# Patient Record
Sex: Female | Born: 1995 | Race: Black or African American | Hispanic: No | Marital: Single | Smoking: Current every day smoker
Health system: Southern US, Community
[De-identification: ages and names within clinical notes are randomized; demographics above are authoritative.]

## PROBLEM LIST (undated history)

## (undated) DIAGNOSIS — D573 Sickle-cell trait: Secondary | ICD-10-CM

## (undated) HISTORY — DX: Sickle-cell trait: D57.3

## (undated) HISTORY — PX: OTHER SURGICAL HISTORY: SHX169

---
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
## 2016-12-15 ENCOUNTER — Encounter (HOSPITAL_COMMUNITY): Payer: Self-pay | Admitting: Nurse Practitioner

## 2016-12-15 ENCOUNTER — Emergency Department (HOSPITAL_COMMUNITY): Payer: Medicaid Other

## 2016-12-15 DIAGNOSIS — R0789 Other chest pain: Secondary | ICD-10-CM | POA: Insufficient documentation

## 2016-12-15 DIAGNOSIS — Z5321 Procedure and treatment not carried out due to patient leaving prior to being seen by health care provider: Secondary | ICD-10-CM | POA: Diagnosis not present

## 2016-12-15 LAB — LIPASE, BLOOD: Lipase: 36 U/L (ref 11–51)

## 2016-12-15 LAB — CBC
HCT: 35.6 % — ABNORMAL LOW (ref 36.0–46.0)
Hemoglobin: 12.1 g/dL (ref 12.0–15.0)
MCH: 28.9 pg (ref 26.0–34.0)
MCHC: 34 g/dL (ref 30.0–36.0)
MCV: 85 fL (ref 78.0–100.0)
Platelets: 237 10*3/uL (ref 150–400)
RBC: 4.19 MIL/uL (ref 3.87–5.11)
RDW: 12.3 % (ref 11.5–15.5)
WBC: 5.4 10*3/uL (ref 4.0–10.5)

## 2016-12-15 LAB — COMPREHENSIVE METABOLIC PANEL
ALT: 13 U/L — ABNORMAL LOW (ref 14–54)
AST: 22 U/L (ref 15–41)
Albumin: 3.9 g/dL (ref 3.5–5.0)
Alkaline Phosphatase: 52 U/L (ref 38–126)
Anion gap: 8 (ref 5–15)
BUN: 10 mg/dL (ref 6–20)
CO2: 23 mmol/L (ref 22–32)
Calcium: 9.2 mg/dL (ref 8.9–10.3)
Chloride: 106 mmol/L (ref 101–111)
Creatinine, Ser: 0.77 mg/dL (ref 0.44–1.00)
GFR calc Af Amer: >60 mL/min (ref >60)
GFR calc non Af Amer: >60 mL/min (ref >60)
Glucose, Bld: 113 mg/dL — ABNORMAL HIGH (ref 65–99)
Potassium: 3.6 mmol/L (ref 3.5–5.1)
Sodium: 137 mmol/L (ref 135–145)
Total Bilirubin: 0.4 mg/dL (ref 0.3–1.2)
Total Protein: 7.3 g/dL (ref 6.5–8.1)

## 2016-12-15 LAB — URINALYSIS, ROUTINE W REFLEX MICROSCOPIC
Bilirubin Urine: NEGATIVE
Glucose, UA: NEGATIVE mg/dL
Hgb urine dipstick: NEGATIVE
Ketones, ur: NEGATIVE mg/dL
Leukocytes, UA: NEGATIVE
Nitrite: NEGATIVE
Protein, ur: NEGATIVE mg/dL
Specific Gravity, Urine: 1.016 (ref 1.005–1.030)
pH: 8 (ref 5.0–8.0)

## 2016-12-15 LAB — I-STAT TROPONIN, ED: Troponin i, poc: 0 ng/mL (ref 0.00–0.08)

## 2016-12-15 NOTE — ED Triage Notes (Signed)
Pt was at [employer] dinner eating and noticed about an hour ago she had epigastric-midchest pain non radiating. Pt thought it was related to eating greasy food so she stopped eating but symptoms persisted causing shob. Pain became more severe pt denies nausea, vomiting, diarrhea, fevers or chills. Pt states enroute to ER pt started having frontal headache and increase shob. Pt sts has had panic attacks with CP but this felt different.

## 2016-12-16 ENCOUNTER — Emergency Department (HOSPITAL_COMMUNITY)
Admission: EM | Admit: 2016-12-16 | Discharge: 2016-12-16 | Disposition: A | Discharge status: Home / Self Care | Payer: Medicaid Other | Attending: Emergency Medicine | Admitting: Emergency Medicine

## 2016-12-16 NOTE — ED Notes (Signed)
Pt and her visitors was seen walking out of the lobby.

## 2017-03-29 ENCOUNTER — Ambulatory Visit (HOSPITAL_COMMUNITY)
Admission: EM | Admit: 2017-03-29 | Discharge: 2017-03-29 | Disposition: A | Discharge status: Home / Self Care | Payer: Medicaid Other | Attending: Family Medicine | Admitting: Family Medicine

## 2017-03-29 ENCOUNTER — Other Ambulatory Visit: Payer: Self-pay

## 2017-03-29 ENCOUNTER — Encounter (HOSPITAL_COMMUNITY): Payer: Self-pay | Admitting: Emergency Medicine

## 2017-03-29 DIAGNOSIS — Z3201 Encounter for pregnancy test, result positive: Secondary | ICD-10-CM

## 2017-03-29 DIAGNOSIS — F411 Generalized anxiety disorder: Secondary | ICD-10-CM

## 2017-03-29 LAB — POCT PREGNANCY, URINE: Preg Test, Ur: POSITIVE — AB

## 2017-03-29 NOTE — ED Triage Notes (Signed)
Pt states she feels better, denies symptoms

## 2017-03-29 NOTE — ED Triage Notes (Signed)
Pt also requesting pregnancy test

## 2017-03-29 NOTE — ED Triage Notes (Signed)
Pt states this morning she woke up feeling tired, and then went to school and had a headache, then started getting chest tightness, stated "I feel like I was about to have a panic attack."

## 2017-04-03 NOTE — ED Provider Notes (Signed)
  [facility]   [id number] 03/29/17 Arrival Time: 1155  ASSESSMENT & PLAN:  1. Anxiety state   2. Positive pregnancy test    Current symptoms related to possibility of pregnancy, confirmed here. She plans f/u with an OB. Able to talk with mother concerning pregnancy. May f/u here as needed.  Reviewed expectations re: course of current medical issues. Questions answered. Outlined signs and symptoms indicating need for more acute intervention. Patient verbalized understanding. After Visit Summary given.   SUBJECTIVE:  Misty Robertson is a 22 y.o. female who presents with complaint of:  Anxiety: Patient complains of feeling anxious because she thinks she may be pregnant. Patient's last menstrual period was 02/15/2017.  No abdominal pain, vaginal d/c or bleeding. No prior pregnancies. "Just nervous." No urinary symptoms. Otherwise well.  ROS: As per HPI.   OBJECTIVE:  Vitals:   03/29/17 1329  BP: 110/64  Pulse: 69  Resp: 18  Temp: 98.4 F (36.9 C)  SpO2: 100%    General appearance: alert; no distress; does not appear anxious Eyes: PERRLA; EOMI; conjunctiva normal HENT: normocephalic; atraumatic Neck: supple Lungs: clear to auscultation bilaterally Heart: regular rate and rhythm Abdomen: soft, non-tender  Extremities: no cyanosis or edema; symmetrical with no gross deformities Skin: warm and dry Neurologic: normal gait; normal symmetric reflexes Psychological: alert and cooperative; normal mood and affect  Labs: Results for orders placed or performed during the hospital encounter of 03/29/17  Pregnancy, urine POC  Result Value Ref Range   Preg Test, Ur POSITIVE (A) NEGATIVE   Labs Reviewed  POCT PREGNANCY, URINE - Abnormal; Notable for the following components:      Result Value   Preg Test, Ur POSITIVE (*)    All other components within normal limits     Allergies  Allergen Reactions  . Compazine [Prochlorperazine Edisylate] Other (See  Comments)    paralysis    History reviewed. No pertinent past medical history. Social History   Socioeconomic History  . Marital status: Single    Spouse name: Not on file  . Number of children: Not on file  . Years of education: Not on file  . Highest education level: Not on file  Social Needs  . Financial resource strain: Not on file  . Food insecurity - worry: Not on file  . Food insecurity - inability: Not on file  . Transportation needs - medical: Not on file  . Transportation needs - non-medical: Not on file  Occupational History  . Not on file  Tobacco Use  . Smoking status: Former Smoker    Last attempt to quit: 12/08/2016    Years since quitting: 0.3  . Smokeless tobacco: Never Used  Substance and Sexual Activity  . Alcohol use: No    Comment: ocassionally  . Drug use: Yes    Types: Marijuana    Comment: quit 10/20118  . Sexual activity: Yes  Other Topics Concern  . Not on file  Social History Narrative  . Not on file   History reviewed. No pertinent surgical history.    Mardella LaymanHagler, Achsah Mcquade, MD 04/03/17 [phone]

## 2017-08-02 ENCOUNTER — Encounter (HOSPITAL_COMMUNITY): Payer: Self-pay | Admitting: Emergency Medicine

## 2017-08-02 ENCOUNTER — Ambulatory Visit (HOSPITAL_COMMUNITY)
Admission: EM | Admit: 2017-08-02 | Discharge: 2017-08-02 | Discharge status: Absent without leave | Payer: Medicaid Other

## 2017-08-02 ENCOUNTER — Emergency Department (HOSPITAL_COMMUNITY)
Admission: EM | Admit: 2017-08-02 | Discharge: 2017-08-02 | Disposition: A | Discharge status: Home / Self Care | Payer: Medicaid Other | Attending: Emergency Medicine | Admitting: Emergency Medicine

## 2017-08-02 ENCOUNTER — Other Ambulatory Visit: Payer: Self-pay

## 2017-08-02 DIAGNOSIS — Z87891 Personal history of nicotine dependence: Secondary | ICD-10-CM | POA: Insufficient documentation

## 2017-08-02 DIAGNOSIS — S0096XA Insect bite (nonvenomous) of unspecified part of head, initial encounter: Secondary | ICD-10-CM

## 2017-08-02 DIAGNOSIS — S0086XA Insect bite (nonvenomous) of other part of head, initial encounter: Secondary | ICD-10-CM | POA: Insufficient documentation

## 2017-08-02 DIAGNOSIS — W57XXXA Bitten or stung by nonvenomous insect and other nonvenomous arthropods, initial encounter: Secondary | ICD-10-CM | POA: Insufficient documentation

## 2017-08-02 DIAGNOSIS — Y999 Unspecified external cause status: Secondary | ICD-10-CM | POA: Insufficient documentation

## 2017-08-02 DIAGNOSIS — Y939 Activity, unspecified: Secondary | ICD-10-CM | POA: Insufficient documentation

## 2017-08-02 DIAGNOSIS — Y9259 Other trade areas as the place of occurrence of the external cause: Secondary | ICD-10-CM | POA: Insufficient documentation

## 2017-08-02 DIAGNOSIS — Z79899 Other long term (current) drug therapy: Secondary | ICD-10-CM | POA: Insufficient documentation

## 2017-08-02 MED ORDER — PERMETHRIN 5 % EX CREA: TOPICAL_CREAM | CUTANEOUS | 0 refills | Status: DC

## 2017-08-02 NOTE — ED Triage Notes (Signed)
Pt. Stated, I stayed a t a motel on Monday night and I might have be bugs , I have a rash on my face or bumps.

## 2017-08-02 NOTE — ED Provider Notes (Signed)
[facility] Provider Note   CSN: [id number] Arrival date & time: 08/02/17  1159   History   Chief Complaint Chief Complaint  Patient presents with  . Rash    bed bugs    HPI Misty Robertson is a 22 y.o. female.  HPI   [age] YOF presents today with complaints of rash to her face.  Patient notes that she stated a hotel 2 days ago.  She notes she woke up next morning bumps to the left side of her face, she notes these are itchy.  She did not take any medication prior to arrival.  She reports she had someone in the room with her they have no similar symptoms.  She has no other tenderness throughout the rest of her body.  No history of the same.   History reviewed. No pertinent past medical history.  There are no active problems to display for this patient.   History reviewed. No pertinent surgical history.   OB History   None      Home Medications    Prior to Admission medications   Medication Sig Start Date End Date Taking? Authorizing Provider  norgestrel-ethinyl estradiol (LO/OVRAL,CRYSELLE) 0.3-30 MG-MCG tablet Take 1 tablet by mouth daily. Patient not taking: Reported on 03/29/2017 08/17/15   Jennye MoccasinQuigley, Brian S, MD  ondansetron (ZOFRAN) 4 MG tablet Take 1 tablet (4 mg total) by mouth every 8 (eight) hours as needed for nausea or vomiting. Patient not taking: Reported on 03/29/2017 05/10/15   Jennye MoccasinQuigley, Brian S, MD  permethrin [place](ELIMITE) 5 % cream Apply to affected area once 08/02/17   Konrad Hoak, Tinnie GensJeffrey, PA-C    Family History No family history on file.  Social History Social History   Tobacco Use  . Smoking status: Former Smoker    Last attempt to quit: 12/08/2016    Years since quitting: 0.6  . Smokeless tobacco: Never Used  Substance Use Topics  . Alcohol use: No    Comment: ocassionally  . Drug use: Yes    Types: Marijuana    Comment: quit 10/20118     Allergies   Compazine [prochlorperazine edisylate]   Review of  Systems Review of Systems  All other systems reviewed and are negative.  Physical Exam Updated Vital Signs BP 108/70 (BP Location: Right Arm)   Pulse 70   Temp 98.5 F (36.9 C)   Resp 16   Ht 5\' 4"  (1.626 m)   Wt 79.8 kg (176 lb)   LMP 07/01/2017   SpO2 100%   BMI 30.21 kg/m   Physical Exam  Constitutional: She is oriented to person, place, and time. She appears well-developed and well-nourished.  HENT:  Head: Normocephalic and atraumatic.  Eyes: Pupils are equal, round, and reactive to light. Conjunctivae are normal. Right eye exhibits no discharge. Left eye exhibits no discharge. No scleral icterus.  Neck: Normal range of motion. No JVD present. No tracheal deviation present.  Pulmonary/Chest: Effort normal. No stridor.  Neurological: She is alert and oriented to person, place, and time. Coordination normal.  Psychiatric: She has a normal mood and affect. Her behavior is normal. Judgment and thought content normal.  Nursing note and vitals reviewed.   ED Treatments / Results  Labs (all labs ordered are listed, but only abnormal results are displayed) Labs Reviewed - No data to display  EKG None  Radiology No results found.  Procedures Procedures (including critical care time)  Medications Ordered in ED Medications - No data to  display   Initial Impression / Assessment and Plan / ED Course  I have reviewed the triage vital signs and the nursing notes.  Pertinent labs & imaging results that were available during my care of the patient were reviewed by me and considered in my medical decision making (see chart for details).      Labs:   Imaging:  Consults:  Therapeutics: Permethrin  Discharge Meds:   Assessment/Plan: 22 year old female presents today with a rash to her face.  This appears to be insect bite.  Higher suspicion for bedbugs or bites, low suspicion for scabies.  Patient has no other involvement.  She was given a prescription for permethrin,  she will use antihistamines if she develops any worsening symptoms including itching, spreading rash she will initiate therapy with permethrin.  Patient given strict return precautions, she verbalized understanding and agreement to this point.    Final Clinical Impressions(s) / ED Diagnoses   Final diagnoses:  Insect bite of head, unspecified part, initial encounter    ED Discharge Orders        Ordered    permethrin (ELIMITE) 5 % cream     08/02/17 1309       Eyvonne Mechanic, PA-C 08/02/17 1431    Mancel Bale, MD 08/03/17 1335

## 2017-08-02 NOTE — Discharge Instructions (Addendum)
Please read attached information. If you experience any new or worsening signs or symptoms please return to the emergency room for evaluation. Please follow-up with your primary care provider or specialist as discussed. Please use medication prescribed only as directed and discontinue taking if you have any concerning signs or symptoms.   °

## 2017-09-05 LAB — HM HIV SCREENING LAB: HM HIV Screening: NEGATIVE

## 2018-09-10 DIAGNOSIS — D573 Sickle-cell trait: Secondary | ICD-10-CM

## 2018-09-12 ENCOUNTER — Ambulatory Visit: Payer: Self-pay

## 2018-09-27 ENCOUNTER — Other Ambulatory Visit: Payer: Self-pay

## 2018-09-27 ENCOUNTER — Ambulatory Visit: Payer: Self-pay

## 2018-09-27 ENCOUNTER — Ambulatory Visit (LOCAL_COMMUNITY_HEALTH_CENTER): Payer: Self-pay | Admitting: Nurse Practitioner

## 2018-09-27 ENCOUNTER — Encounter: Payer: Self-pay | Admitting: Nurse Practitioner

## 2018-09-27 DIAGNOSIS — Z113 Encounter for screening for infections with a predominantly sexual mode of transmission: Secondary | ICD-10-CM

## 2018-09-27 DIAGNOSIS — F419 Anxiety disorder, unspecified: Secondary | ICD-10-CM | POA: Insufficient documentation

## 2018-09-27 DIAGNOSIS — F1290 Cannabis use, unspecified, uncomplicated: Secondary | ICD-10-CM | POA: Insufficient documentation

## 2018-09-27 LAB — WET PREP FOR TRICH, YEAST, CLUE
Trichomonas Exam: NEGATIVE
Yeast Exam: NEGATIVE

## 2018-09-27 MED ORDER — THERA VITAL M PO TABS: 1.0000 | ORAL_TABLET | Freq: Every day | ORAL | 0 refills | Status: DC

## 2018-09-27 NOTE — Progress Notes (Signed)
Patient here for STD testing.Maleia Weems Brewer-Jensen, RN 

## 2018-09-27 NOTE — Progress Notes (Signed)
    STI clinic/screening visit  Subjective:  Misty Robertson is a 23 y.o. female being seen today for an STI screening visit. The patient reports they do not have symptoms.  Patient has the following medical conditions:   Patient Active Problem List   Diagnosis Date Noted  . Marijuana use 09/27/2018  . Anxiety 09/27/2018  . 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 Exposure to STD    Reason for visit: STD testing - declines blood work Pharmacist, community as contact to STD? - no Signs and symptoms: denies Allergies:Compazine Taken antibiotics in last 2 wks: no Hx of previous STD - Chlamydia Taking any medications at this time: denies Immunizations needed: Tdap Last sex:09/25/2018 X's in last 2 wks:4 With condoms: never Sexual partners in last 2 months:2 Type of PPI:RJJOACZ and oral Smoking currently/former smoker:denies Last ETOH use: 8/1/220 IVDU:denies Other drugs: MJ use - weekly Travel in last 3 months:denies Abuse hx - denies Anxiety and depression: anxiety - declines services LMP - (normal):09/22/2018 Last pap - 2019 - WNL Douche - denies G 1 P 0 EAB 1 SAB 0    Current BCM: denies Starting [facility] today? - yes OCP's Declines blood work -  yes    Patient reports - denies any S&S of STD  See flowsheet for further details and programmatic requirements.    The following portions of the patient's history were reviewed and updated as appropriate: allergies, current medications, past medical history, past social history, past surgical history and problem list.  Objective:  There were no vitals filed for this visit.  Physical Exam Vitals signs reviewed.  Constitutional:      Appearance: Normal appearance. She is well-developed and normal weight.  HENT:     Mouth/Throat:     Comments: GC culture obtained Pulmonary:     Effort: Pulmonary effort is normal.  Skin:    General: Skin is warm and dry.  Neurological:     Mental Status:  She is alert.  Psychiatric:        Behavior: Behavior is cooperative.       Assessment and Plan:  Misty Robertson is a 23 y.o. female presenting to the [facility] Department for STI screening  1. Screening examination for STD (sexually transmitted disease) Await test results - WET PREP FOR [place], YEAST, CLUE - please treat wet mount per standing order - client self collected - [place] culture  Immunization nurse consult - Tdap Client verbalizes understanding and is in agreement with plan of care   No follow-ups on file.  No future appointments.  Berniece Andreas, NP

## 2018-09-27 NOTE — Progress Notes (Signed)
Wet mount reviewed, no treatment indicated at this time.Timber Lucarelli Brewer-Jensen, RN 

## 2018-10-02 LAB — GONOCOCCUS CULTURE

## 2018-10-04 ENCOUNTER — Telehealth: Payer: Self-pay

## 2018-10-04 NOTE — Telephone Encounter (Signed)
Attempted TC to patient. VM not set up.  Due to specimen tube error lab could not test specimens. Will attempt to make contact with patient and schedule collections appt. Aileen Fass, RN

## 2018-10-08 NOTE — Telephone Encounter (Signed)
TC with patient.  Informed of lab error with specimens. Patient would like to come in for retest (Vaginal and oropharyngeal) but can only come Wednesday morning before lunch. Aileen Fass, RN

## 2018-10-10 ENCOUNTER — Ambulatory Visit: Payer: Self-pay

## 2018-10-31 IMAGING — DX DG CHEST 2V
2 series · 2 of 2 positions shown · non-contrast
Comparison: None.

CLINICAL DATA: Chest pain.  Smoker.

EXAM:
CHEST  2 VIEW

[w chest pa]
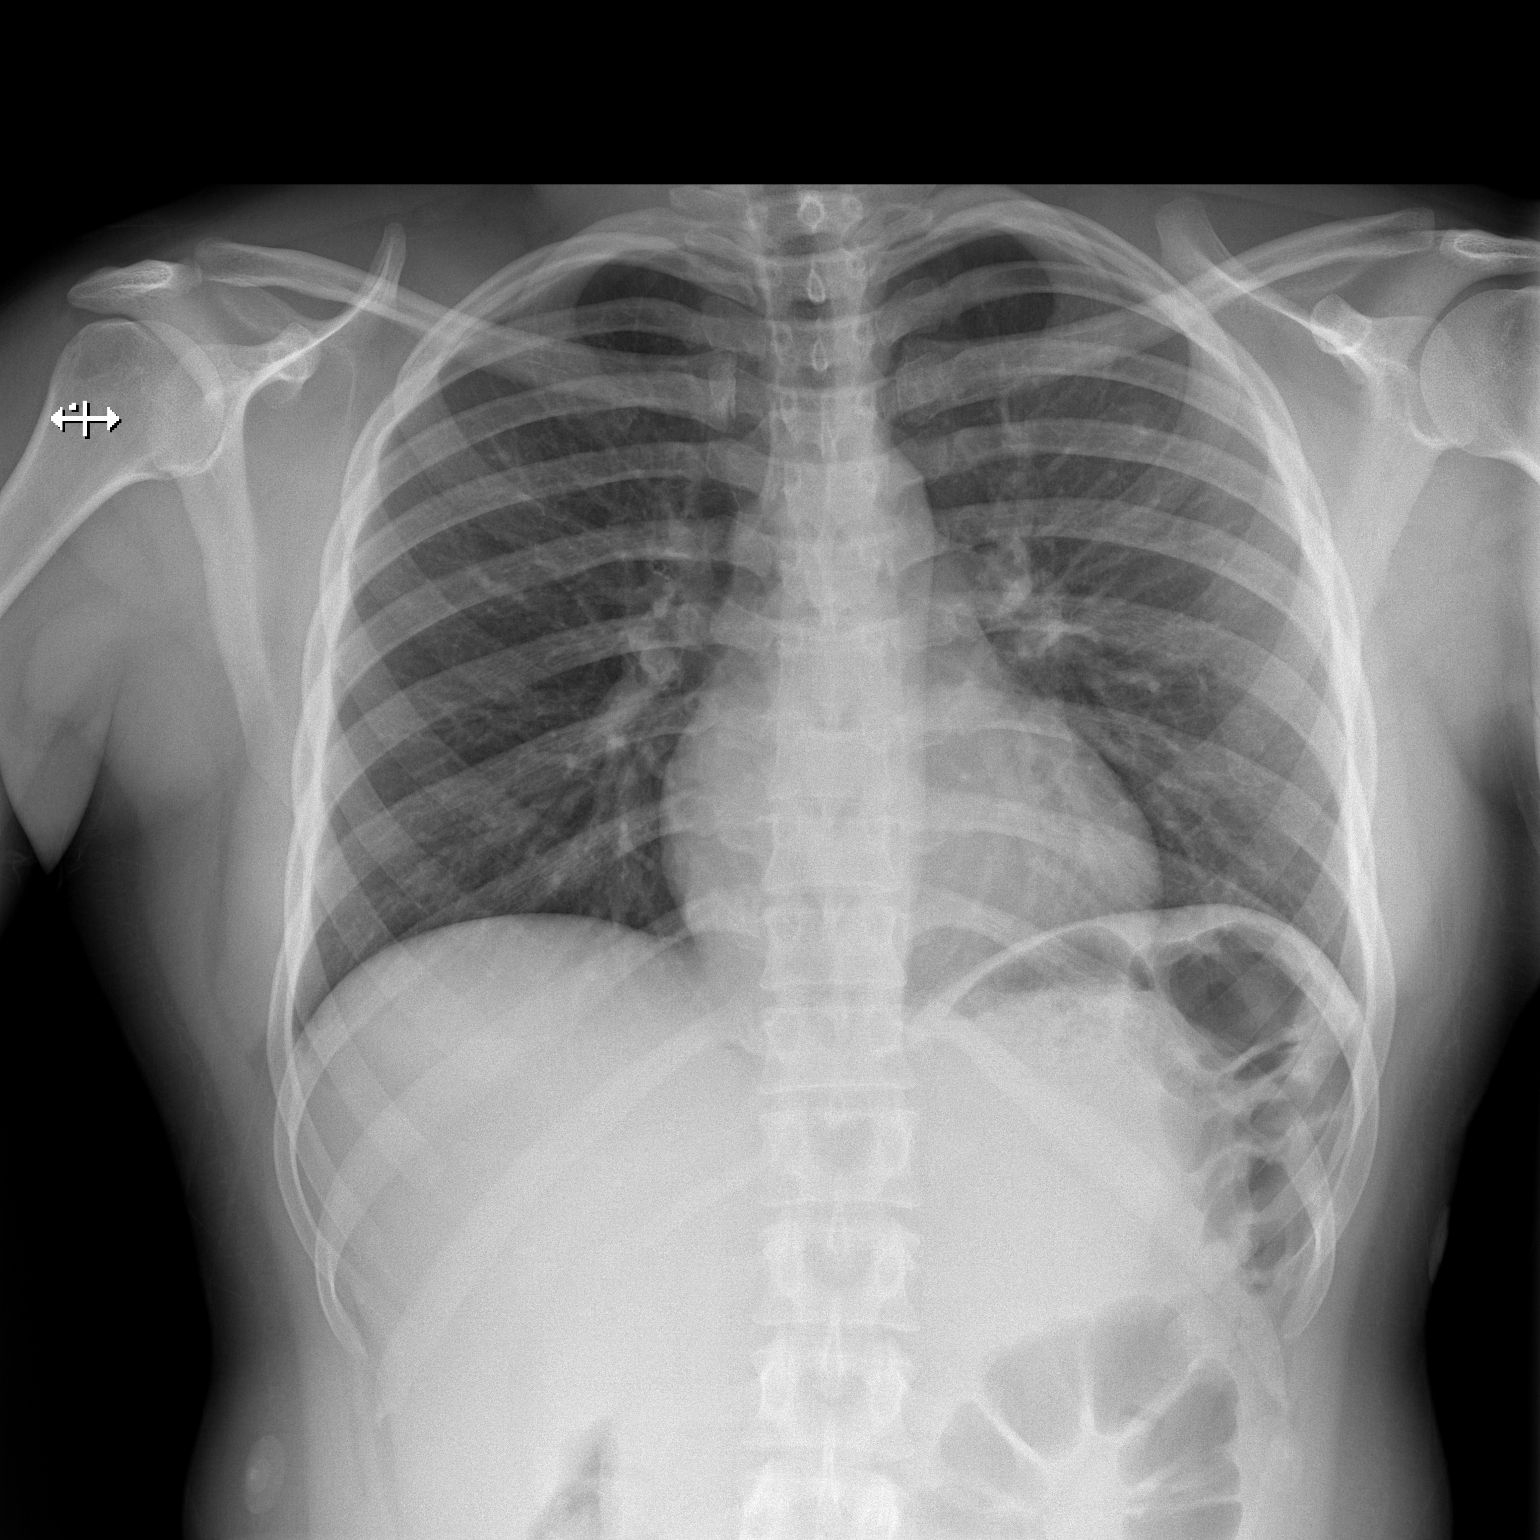

[w chest lat]
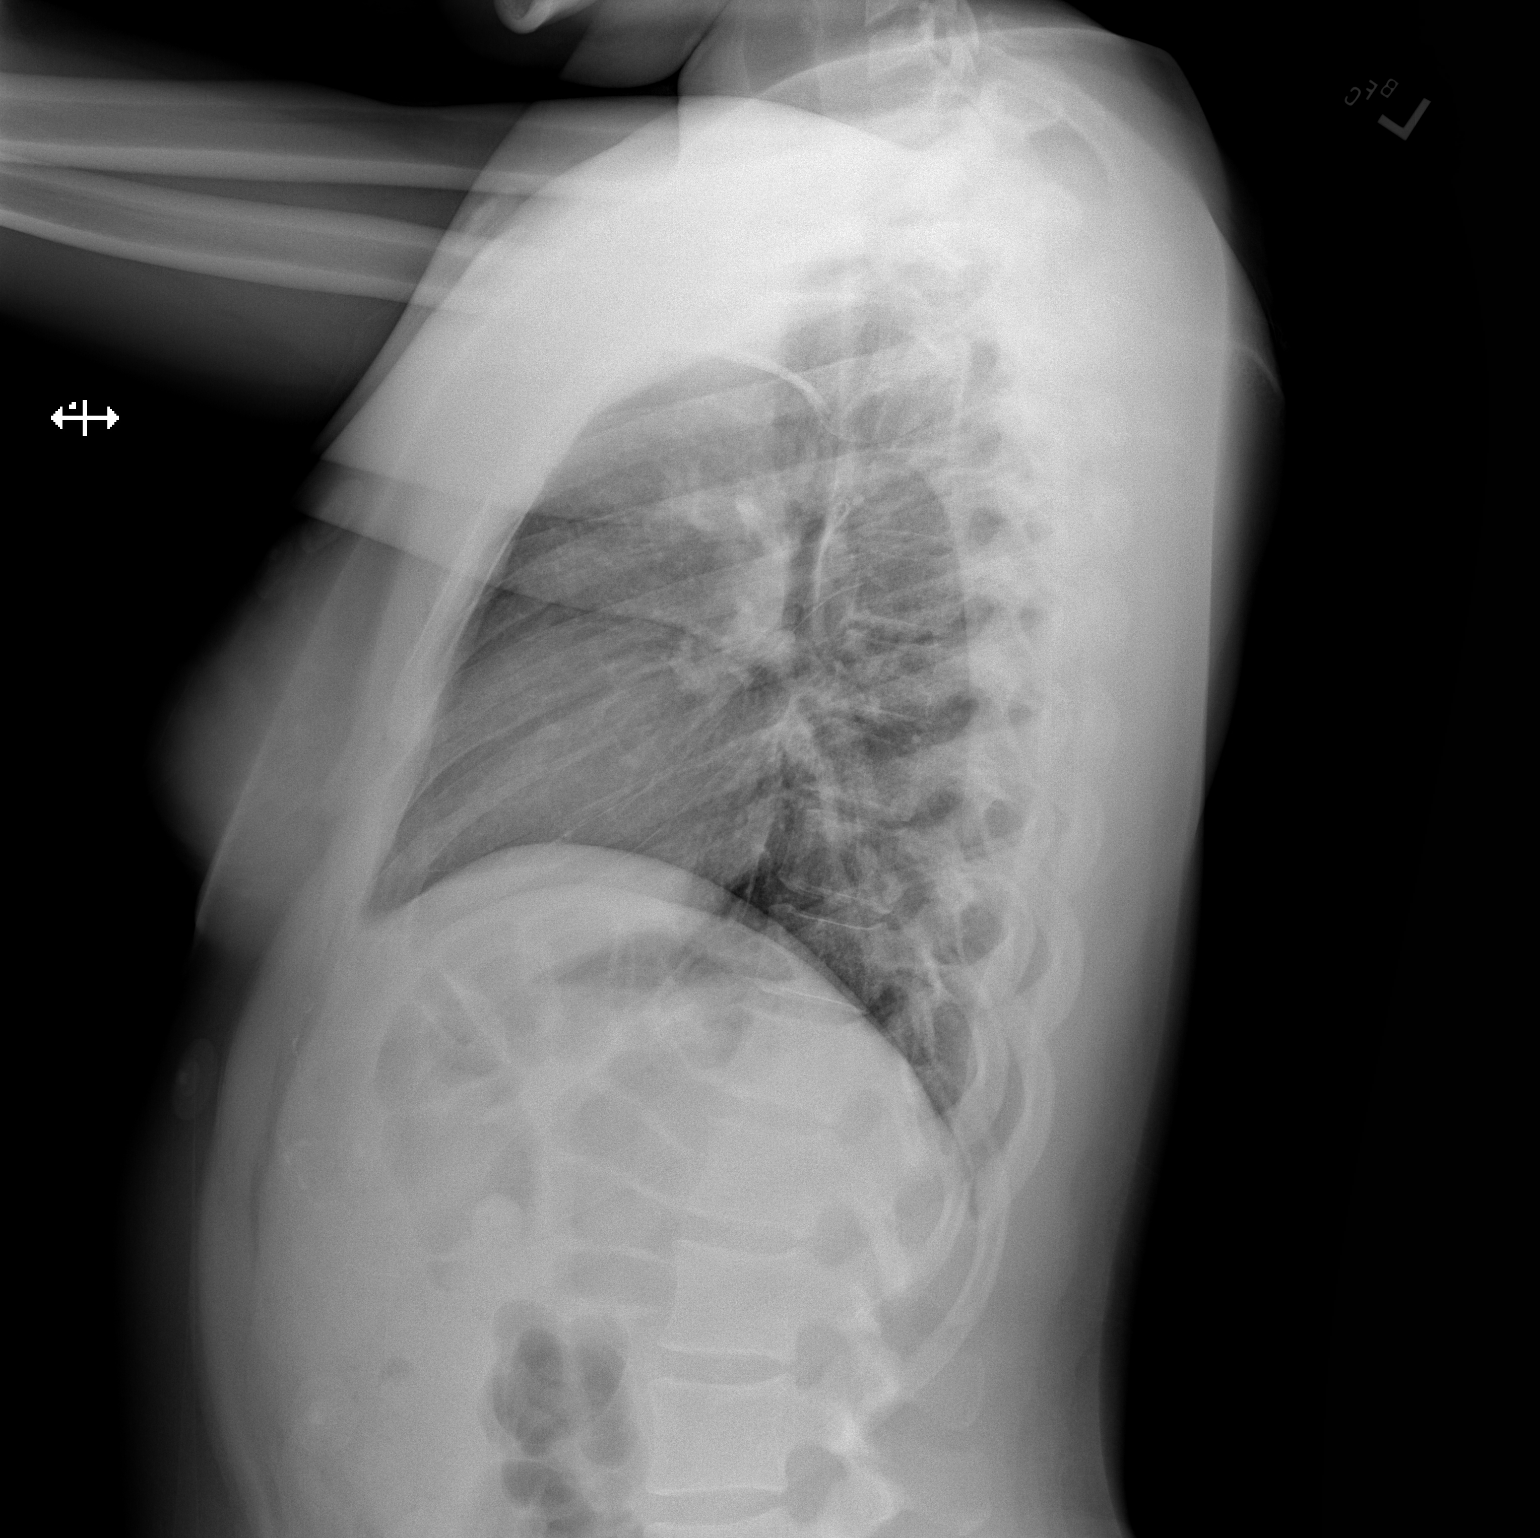

[2 of 2 positions shown; findings below may reference images not displayed]

FINDINGS: Normal sized heart. Clear lungs with normal vascularity. Minimal
central peribronchial thickening. Unremarkable bones.
IMPRESSION: Minimal bronchitic changes.

## 2018-11-06 ENCOUNTER — Ambulatory Visit: Payer: Self-pay | Admitting: Family Medicine

## 2018-11-06 ENCOUNTER — Encounter: Payer: Self-pay | Admitting: Family Medicine

## 2018-11-06 ENCOUNTER — Other Ambulatory Visit: Payer: Self-pay

## 2018-11-06 DIAGNOSIS — B9689 Other specified bacterial agents as the cause of diseases classified elsewhere: Secondary | ICD-10-CM

## 2018-11-06 DIAGNOSIS — N760 Acute vaginitis: Secondary | ICD-10-CM

## 2018-11-06 DIAGNOSIS — Z113 Encounter for screening for infections with a predominantly sexual mode of transmission: Secondary | ICD-10-CM

## 2018-11-06 MED ORDER — METRONIDAZOLE 500 MG PO TABS: 500.0000 mg | ORAL_TABLET | Freq: Two times a day (BID) | ORAL | 0 refills | Status: AC

## 2018-11-06 NOTE — Progress Notes (Signed)
STI clinic/screening visit  Subjective:  Misty Robertson is a 23 y.o. female being seen today for an STI screening visit. The patient reports they do not have symptoms.  Patient has the following medical conditions:   Patient Active Problem List   Diagnosis Date Noted  . Marijuana use 09/27/2018  . Anxiety 09/27/2018  . 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 Exposure to STD    HPI  Patient reports no sx  See flowsheet for further details and programmatic requirements.    The following portions of the patient's history were reviewed and updated as appropriate: allergies, current medications, past medical history, past social history, past surgical history and problem list.  Objective:  There were no vitals filed for this visit.  Physical Exam Vitals signs and nursing note reviewed.  Constitutional:      Appearance: Normal appearance.  HENT:     Head: Normocephalic and atraumatic.     Mouth/Throat:     Mouth: Mucous membranes are moist.     Pharynx: Oropharynx is clear. No oropharyngeal exudate or posterior oropharyngeal erythema.  Pulmonary:     Effort: Pulmonary effort is normal.  Abdominal:     General: Abdomen is flat.     Palpations: There is no mass.     Tenderness: There is no abdominal tenderness. There is no rebound.  Genitourinary:    General: Normal vulva.     Exam position: Lithotomy position.     Pubic Area: No rash or pubic lice.      Labia:        Right: No rash or lesion.        Left: No rash or lesion.      Vagina: Normal. No vaginal discharge, erythema, bleeding or lesions.     Cervix: Discharge (pH>4.5, Moderate thick white, homogenous) present. No cervical motion tenderness, friability, lesion or erythema.     Uterus: Normal.      Adnexa: Right adnexa normal and left adnexa normal.     Rectum: Normal.  Lymphadenopathy:     Head:     Right side of head: No preauricular or posterior auricular  adenopathy.     Left side of head: No preauricular or posterior auricular adenopathy.     Cervical: No cervical adenopathy.     Upper Body:     Right upper body: No supraclavicular or axillary adenopathy.     Left upper body: No supraclavicular or axillary adenopathy.     Lower Body: No right inguinal adenopathy. No left inguinal adenopathy.  Skin:    General: Skin is warm and dry.     Findings: No rash.  Neurological:     Mental Status: She is alert and oriented to person, place, and time.       Assessment and Plan:  Misty Robertson is a 23 y.o. female presenting to the [facility] Department for STI screening   1. Screening examination for venereal disease Patient accepted all screenings including oral, vaginal CT/GC and bloodwork for HIV/RPR. Patient does meets criteria for HepB and HepC Screening and accepts screening for these today. Treat wet prep per standing order Discussed that GC/CT NAAT may take >1 week to result. Patient will be called with positive results and encouraged patient to call if she had not heard in 2 weeks.  Counseled on warning s/sx and when to seek care Recommended condom use with all sex Patient is currently using  nothing to prevent pregnancy.   - WET PREP FOR TRICH, YEAST, CLUE - HBV Antigen/Antibody [facility] Lab - HIV/HCV [place] [facility] - Syphilis Serology, [place] [facility] - Chlamydia/Gonorrhea [place] [facility] - Chlamydia/Gonorrhea [place] [facility]   Return if symptoms worsen or fail to improve.  No future appointments.  Federico FlakeKimberly Niles Lavana Huckeba, MD

## 2018-11-06 NOTE — Progress Notes (Signed)
Wet Prep results reviewed. Patient treated for BV per standing orders. Cigi Bega, RN  

## 2018-11-06 NOTE — Progress Notes (Signed)
Patient here for STD testing.Mariah Gerstenberger Brewer-Jensen, RN 

## 2018-11-07 LAB — WET PREP FOR TRICH, YEAST, CLUE
Clue Cell Exam: POSITIVE — AB
Trichomonas Exam: NEGATIVE

## 2018-11-19 LAB — HM HEPATITIS C SCREENING LAB: HM Hepatitis Screen: NEGATIVE

## 2018-11-19 LAB — HM HIV SCREENING LAB: HM HIV Screening: NEGATIVE

## 2018-12-20 ENCOUNTER — Ambulatory Visit: Payer: Self-pay

## 2019-01-02 ENCOUNTER — Telehealth: Payer: Self-pay

## 2019-01-31 NOTE — Telephone Encounter (Signed)
Attempted TC to patient.  Unsure if patient had Hep B drawn at last visit. Aileen Fass, RN

## 2019-02-13 ENCOUNTER — Ambulatory Visit: Payer: Medicaid Other

## 2019-03-13 ENCOUNTER — Ambulatory Visit: Payer: Self-pay | Admitting: Advanced Practice Midwife

## 2019-03-13 ENCOUNTER — Other Ambulatory Visit: Payer: Self-pay

## 2019-03-13 DIAGNOSIS — Z113 Encounter for screening for infections with a predominantly sexual mode of transmission: Secondary | ICD-10-CM

## 2019-03-13 LAB — WET PREP FOR TRICH, YEAST, CLUE
Trichomonas Exam: NEGATIVE
Yeast Exam: NEGATIVE

## 2019-03-13 NOTE — Progress Notes (Signed)
Wet mount reviewed, no tx per standing order. Provider orders completed. 

## 2019-03-13 NOTE — Progress Notes (Signed)
[facility] Department STI clinic/screening visit  Subjective:  Misty Robertson is a 24 y.o. G1P0  female being seen today for an STI screening visit. The patient reports they do not have symptoms.  Patient reports that they do not desire a pregnancy in the next year.   They reported they are not interested in discussing contraception today.  Patient's last menstrual period was 02/13/2019 (exact date).  LMP 02/16/19.  Smokes MJ   Patient has the following medical conditions:   Patient Active Problem List   Diagnosis Date Noted  . Marijuana use 09/27/2018  . Anxiety 09/27/2018  . 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 SEXUALLY TRANSMITTED DISEASE    HPI  Patient reports just wants screen today  See flowsheet for further details and programmatic requirements.    The following portions of the patient's history were reviewed and updated as appropriate: allergies, current medications, past medical history, past social history, past surgical history and problem list.  Objective:  There were no vitals filed for this visit.  Physical Exam Vitals and nursing note reviewed.  Constitutional:      Appearance: Normal appearance.  HENT:     Head: Normocephalic and atraumatic.     Mouth/Throat:     Mouth: Mucous membranes are moist.     Pharynx: Oropharynx is clear. No oropharyngeal exudate or posterior oropharyngeal erythema.  Pulmonary:     Effort: Pulmonary effort is normal.  Abdominal:     General: Abdomen is flat.     Palpations: There is no mass.     Tenderness: There is no abdominal tenderness. There is no rebound.  Genitourinary:    General: Normal vulva.     Exam position: Lithotomy position.     Pubic Area: No rash or pubic lice.      Labia:        Right: No rash or lesion.        Left: No rash or lesion.      Vagina: Normal. No vaginal discharge, erythema, bleeding or lesions.     Cervix: No cervical motion  tenderness, discharge, friability, lesion or erythema.     Uterus: Normal.      Adnexa: Right adnexa normal and left adnexa normal.     Rectum: Normal.  Lymphadenopathy:     Head:     Right side of head: No preauricular or posterior auricular adenopathy.     Left side of head: No preauricular or posterior auricular adenopathy.     Cervical: No cervical adenopathy.     Upper Body:     Right upper body: No supraclavicular or axillary adenopathy.     Left upper body: No supraclavicular or axillary adenopathy.     Lower Body: No right inguinal adenopathy. No left inguinal adenopathy.  Skin:    General: Skin is warm and dry.     Findings: No rash.  Neurological:     Mental Status: She is alert and oriented to person, place, and time.      Assessment and Plan:  Misty Robertson is a 24 y.o. female presenting to the [facility] Department for STI screening  1. Screening examination for venereal disease Treat wet mount per standing orders Immunization nurse consult - HIV [place] [facility] - Syphilis Serology, [place] [facility] - Chlamydia/Gonorrhea [address] [facility] - Gonococcus culture - WET PREP FOR TRICH, YEAST, CLUE     Return if symptoms worsen  or fail to improve.  No future appointments.  Herbie Saxon, CNM

## 2019-03-13 NOTE — Progress Notes (Signed)
In for screening; denies s/s Sharlette Dense, RN

## 2019-03-18 LAB — GONOCOCCUS CULTURE

## 2019-03-20 ENCOUNTER — Encounter: Payer: Self-pay | Admitting: Advanced Practice Midwife

## 2019-05-28 ENCOUNTER — Encounter: Payer: Self-pay | Admitting: Family Medicine

## 2019-05-28 ENCOUNTER — Ambulatory Visit: Payer: Self-pay | Admitting: Family Medicine

## 2019-05-28 ENCOUNTER — Other Ambulatory Visit: Payer: Self-pay

## 2019-05-28 VITALS — Ht 64.0 in | Wt 165.2 lb

## 2019-05-28 DIAGNOSIS — Z789 Other specified health status: Secondary | ICD-10-CM

## 2019-05-28 DIAGNOSIS — Z113 Encounter for screening for infections with a predominantly sexual mode of transmission: Secondary | ICD-10-CM

## 2019-05-28 LAB — WET PREP FOR TRICH, YEAST, CLUE
Trichomonas Exam: NEGATIVE
Yeast Exam: NEGATIVE

## 2019-05-28 MED ORDER — ULIPRISTAL ACETATE 30 MG PO TABS: 1.0000 | ORAL_TABLET | Freq: Once | ORAL | 0 refills | Status: DC

## 2019-05-28 MED ORDER — LEVONORGESTREL 1.5 MG PO TABS: 1.5000 mg | ORAL_TABLET | Freq: Once | ORAL | 0 refills | Status: AC

## 2019-05-28 NOTE — Progress Notes (Signed)
[facility] Department STI clinic/screening visit  Subjective:  Misty Robertson is a 24 y.o. female being seen today for  Chief Complaint  Patient presents with  . SEXUALLY TRANSMITTED DISEASE     The patient reports they does not have symptoms. Patient reports that they do not desire a pregnancy in the next year. They reported they are not interested in discussing contraception today.   Patient has the following medical conditions:   Patient Active Problem List   Diagnosis Date Noted  . Marijuana use 09/27/2018  . Anxiety 09/27/2018  . 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 Declines bloodwork.   See flowsheet for further details and programmatic requirements.    Patient's last menstrual period was 05/10/2019 (exact date). Last sex: 5 days ago BCM: none Desires EC? yes   No components found for: HCV  The following portions of the patient's history were reviewed and updated as appropriate: allergies, current medications, past medical history, past social history, past surgical history and problem list.  Objective:   Vitals:   05/28/19 1131  Weight: 165 lb 3.2 oz (74.9 kg)  Height: 5\' 4"  (1.626 m)     Physical Exam Vitals and nursing note reviewed.  Constitutional:      Appearance: Normal appearance.  HENT:     Head: Normocephalic and atraumatic.     Mouth/Throat:     Mouth: Mucous membranes are moist.     Pharynx: Oropharynx is clear. No oropharyngeal exudate or posterior oropharyngeal erythema.  Pulmonary:     Effort: Pulmonary effort is normal.  Abdominal:     General: Abdomen is flat.     Palpations: There is no mass.     Tenderness: There is no abdominal tenderness. There is no rebound.  Genitourinary:    Comments: Declines pelvic exam, prefers to self collect.  Lymphadenopathy:     Head:     Right side of head: No preauricular or posterior auricular adenopathy.     Left side of  head: No preauricular or posterior auricular adenopathy.     Cervical: No cervical adenopathy.     Upper Body:     Right upper body: No supraclavicular or axillary adenopathy.     Left upper body: No supraclavicular or axillary adenopathy.  Skin:    General: Skin is warm and dry.     Findings: No rash.  Neurological:     Mental Status: She is alert and oriented to person, place, and time.     Assessment and Plan:  Misty Robertson is a 24 y.o. female presenting to the [facility] Department for STI screening   1. Screening examination for venereal disease -Pt without symptoms. Screenings today as below. Treat wet prep per standing order. -Patient does meet criteria for HepB, HepC Screening. Declines these and HIV and syphilis screenings. -Counseled on warning s/sx and when to seek care. Recommended condom use with all sex and discussed importance of condom use for STI prevention. - WET PREP FOR TRICH, YEAST, CLUE - Chlamydia/Gonorrhea [place] [facility]  2. Emergency contraception -Last unprotected sex 5 days ago, pt would like EC.  -I tried to send ulipristal acetate (as her weight is just over 165#) to 2 different pharmacies preferred by the pt but they are out of stock. Will send Plan-B instead.  - levonorgestrel (PLAN B ONE-STEP) 1.5 MG tablet; Take 1 tablet (1.5 mg total) by mouth once for 1  dose.  Dispense: 1 tablet; Refill: 0     Return for screening as needed.  No future appointments.  Kandee Keen, PA-C

## 2019-05-28 NOTE — Progress Notes (Signed)
Here today for STD screening. Declines bloodwork. Elaiza Shoberg, RN ? ?

## 2019-05-28 NOTE — Progress Notes (Signed)
Wet Mount results reviewed by provider J. Staples, PA-C. Per same no treatment indicated. Aspen Deterding, RN ° °

## 2019-06-13 ENCOUNTER — Ambulatory Visit (HOSPITAL_COMMUNITY): Admission: EM | Admit: 2019-06-13 | Discharge: 2019-06-13 | Discharge status: Against Medical Advice | Payer: Self-pay

## 2019-06-13 ENCOUNTER — Other Ambulatory Visit: Payer: Self-pay

## 2019-06-13 NOTE — ED Notes (Signed)
Per registration, patient did not want to pay so she left

## 2019-07-29 ENCOUNTER — Ambulatory Visit: Payer: Medicaid Other

## 2019-09-02 ENCOUNTER — Other Ambulatory Visit: Payer: Self-pay

## 2019-09-02 ENCOUNTER — Encounter: Payer: Self-pay | Admitting: Advanced Practice Midwife

## 2019-09-02 ENCOUNTER — Ambulatory Visit (LOCAL_COMMUNITY_HEALTH_CENTER): Payer: Medicaid Other | Admitting: Advanced Practice Midwife

## 2019-09-02 VITALS — BP 96/59 | Ht 64.25 in | Wt 177.4 lb

## 2019-09-02 DIAGNOSIS — Z3009 Encounter for other general counseling and advice on contraception: Secondary | ICD-10-CM

## 2019-09-02 DIAGNOSIS — E669 Obesity, unspecified: Secondary | ICD-10-CM | POA: Insufficient documentation

## 2019-09-02 DIAGNOSIS — F1210 Cannabis abuse, uncomplicated: Secondary | ICD-10-CM | POA: Insufficient documentation

## 2019-09-02 DIAGNOSIS — Z30013 Encounter for initial prescription of injectable contraceptive: Secondary | ICD-10-CM

## 2019-09-02 LAB — WET PREP FOR TRICH, YEAST, CLUE
Trichomonas Exam: NEGATIVE
Yeast Exam: NEGATIVE

## 2019-09-02 LAB — PREGNANCY, URINE: Preg Test, Ur: NEGATIVE

## 2019-09-02 MED ORDER — MEDROXYPROGESTERONE ACETATE 150 MG/ML IM SUSP
150.0000 mg | Freq: Once | INTRAMUSCULAR | Status: AC
  Administered 2019-09-02: 150 mg via INTRAMUSCULAR

## 2019-09-02 MED ORDER — MULTI-VITAMIN/MINERALS PO TABS
1.0000 | ORAL_TABLET | Freq: Every day | ORAL | 0 refills | Status: DC
Start: 2019-09-02 — End: 2022-03-14

## 2019-09-02 NOTE — Progress Notes (Signed)
Family Planning Visit- Initial Visit  Subjective:  Misty Robertson is a 24 y.o. SBF nonsmoker G2P0020 being seen today for an initial well woman visit and to discuss family planning options.  She is currently using nothing for pregnancy prevention. Patient reports she does not want a pregnancy in the next year.  Patient has the following medical conditions has Hemoglobin S trait (HCC); Marijuana use; Anxiety; and Obesity BMI=30.2 on their problem list.  Chief Complaint  Patient presents with  . Annual Exam    Patient reports LMP 08/07/19.  Last PE 02/09/17.  Last sex 08/31/19 without condom; with current partner x 1 year; 2 sex partners in last 3 mo; 3 sex partners in last year.  Wants DMPA because doesn't want pregnancy and declines ECP.  Last MJ last night (daily), last ETOH last night (1 airplane bottle pink whitney) q weekend.    Patient denies cigs, vaping, Black & Milds  Body mass index is 30.21 kg/m. - Patient is eligible for diabetes screening based on BMI and age >57?  not applicable HA1C ordered? not applicable  Patient reports 3 of partners in last year. Desires STI screening?  Yes  Has patient been screened once for HCV in the past?  No  No results found for: HCVAB  Does the patient have current of drug use, have a partner with drug use, and/or has been incarcerated since last result? Yes  If yes-- Screen for HCV through [facility]   Does the patient meet criteria for HBV testing? Yes  Criteria:  -Household, sexual or needle sharing contact with HBV -History of drug use -HIV positive -Those with known Hep C   Health Maintenance Due  Topic Date Due  . COVID-19 Vaccine (1) Never done  . CHLAMYDIA SCREENING  Never done  . TETANUS/TDAP  Never done  . PAP-Cervical Cytology Screening  Never done  . PAP SMEAR-Modifier  Never done    ROS  The following portions of the patient's history were reviewed and updated as appropriate: allergies, current medications,  past family history, past medical history, past social history, past surgical history and problem list. Problem list updated.   See flowsheet for other program required questions.  Objective:   Vitals:   09/02/19 0851  BP: (!) 96/59  Weight: 177 lb 6.4 oz (80.5 kg)  Height: 5' 4.25" (1.632 m)    Physical Exam    Assessment and Plan:  Misty Robertson is a 24 y.o. female presenting to the [facility] Department for an initial well woman exam/family planning visit  Contraception counseling: Reviewed all forms of birth control options in the tiered based approach. available including abstinence; over the counter/barrier methods; hormonal contraceptive medication including pill, patch, ring, injection,contraceptive implant, ECP; hormonal and nonhormonal IUDs; permanent sterilization options including vasectomy and the various tubal sterilization modalities. Risks, benefits, and typical effectiveness rates were reviewed.  Questions were answered.  Written information was also given to the patient to review.  Patient desires DMPA, this was prescribed for patient. She will follow up in 11-13 wks for surveillance.  She was told to call with any further questions, or with any concerns about this method of contraception.  Emphasized use of condoms 100% of the time for STI prevention.  Patient was offered ECP. ECP was not accepted by the patient. ECP counseling was given by provider- see RN documentation  1. Obesity, unspecified classification, unspecified obesity type, unspecified whether serious comorbidity present   2. Family planning  Treat wet mount per standing orders Immunization nurse consult Pt declines counseling for anxiety Pt counseled about her SS trait - WET PREP FOR TRICH, YEAST, CLUE - Chlamydia/Gonorrhea [place] [facility] - HIV/HCV [place] [facility] - HBV Antigen/Antibody [place] Lab - Syphilis Serology, [place] Lab - Pap IG (Image Guided)  3. Encounter for initial  prescription of injectable contraceptive If PT neg today may have DMPA 150 mg IM q 11-13 wks x 1 year Please counsel on need for abstinance/back up condoms next 7 days Pt refuses ECP today Pt counseled to do PT 09/14/19 and call if + - Pregnancy, urine     Return for 11-13 wk DMPA.  No future appointments.  Alberteen Spindle, CNM

## 2019-09-03 LAB — PAP IG (IMAGE GUIDED): PAP Smear Comment: 0

## 2019-09-09 ENCOUNTER — Encounter: Payer: Self-pay | Admitting: Family Medicine

## 2019-09-09 LAB — HM HIV SCREENING LAB: HM HIV Screening: NEGATIVE

## 2019-09-09 LAB — HM HEPATITIS C SCREENING LAB: HM Hepatitis Screen: NEGATIVE

## 2019-12-27 ENCOUNTER — Ambulatory Visit: Payer: Medicaid Other

## 2020-01-23 ENCOUNTER — Ambulatory Visit: Payer: Self-pay | Admitting: Advanced Practice Midwife

## 2020-01-23 ENCOUNTER — Encounter: Payer: Self-pay | Admitting: Advanced Practice Midwife

## 2020-01-23 ENCOUNTER — Other Ambulatory Visit: Payer: Self-pay

## 2020-01-23 DIAGNOSIS — Z113 Encounter for screening for infections with a predominantly sexual mode of transmission: Secondary | ICD-10-CM

## 2020-01-23 LAB — WET PREP FOR TRICH, YEAST, CLUE
Trichomonas Exam: NEGATIVE
Yeast Exam: NEGATIVE

## 2020-01-23 NOTE — Progress Notes (Signed)
Post:  RN reviewed wet mount. No tx per S.O. Provider orders complete.   Harvie Heck, RN

## 2020-01-23 NOTE — Progress Notes (Signed)
[facility] Department STI clinic/screening visit  Subjective:  Misty Robertson is a 24 y.o.SBF G2P0 vaper female being seen today for an STI screening visit. The patient reports they do not have symptoms.  Patient reports that they do not desire a pregnancy in the next year.   They reported they are not interested in discussing contraception today.  Patient's last menstrual period was 10/14/2019.   Patient has the following medical conditions:   Patient Active Problem List   Diagnosis Date Noted  . Obesity BMI=30.2 09/02/2019  . Marijuana abuse daily 09/02/2019  . Anxiety dx'd 2014 09/27/2018  . 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   HPI  Patient reports last sex 01/15/20 without condom; with current partner x 1 year; 1 sex partner in last 3 mo.  LMP 10/14/19.  MJ daily.  Last ETOH yesterday (1 shot Tequila) 5-6x/mo.  Last HIV test per patient/review of record was 09/09/19 Patient reports last pap was 09/02/19 neg  See flowsheet for further details and programmatic requirements.    The following portions of the patient's history were reviewed and updated as appropriate: allergies, current medications, past medical history, past social history, past surgical history and problem list.  Objective:  There were no vitals filed for this visit.  Physical Exam Vitals and nursing note reviewed.  Constitutional:      Appearance: Normal appearance.  HENT:     Head: Normocephalic and atraumatic.     Mouth/Throat:     Mouth: Mucous membranes are moist.     Pharynx: Oropharynx is clear. No oropharyngeal exudate or posterior oropharyngeal erythema.  Eyes:     Conjunctiva/sclera: Conjunctivae normal.  Pulmonary:     Effort: Pulmonary effort is normal.  Abdominal:     Palpations: Abdomen is soft. There is no mass.     Tenderness: There is no abdominal tenderness. There is no rebound.     Comments: Soft fair tone, without masses or  tenderness  Genitourinary:    General: Normal vulva.     Exam position: Lithotomy position.     Pubic Area: No rash or pubic lice.      Labia:        Right: No rash or lesion.        Left: No rash or lesion.      Vagina: Vaginal discharge (white creamy leukorrhea, ph<4.5) present. No erythema, bleeding or lesions.     Cervix: Normal.     Uterus: Normal.      Adnexa: Right adnexa normal and left adnexa normal.     Rectum: Normal.  Lymphadenopathy:     Head:     Right side of head: No preauricular or posterior auricular adenopathy.     Left side of head: No preauricular or posterior auricular adenopathy.     Cervical: No cervical adenopathy.     Upper Body:     Right upper body: No supraclavicular or axillary adenopathy.     Left upper body: No supraclavicular or axillary adenopathy.     Lower Body: No right inguinal adenopathy. No left inguinal adenopathy.  Skin:    General: Skin is warm and dry.     Findings: No rash.  Neurological:     Mental Status: She is alert and oriented to person, place, and time.      Assessment and Plan:  Misty Robertson is a 24 y.o. female presenting to the [facility] Department for STI screening  1. Screening examination for venereal disease Treat wet mount per standing orders Immunization nurse consult - WET PREP FOR TRICH, YEAST, CLUE - Gonococcus culture - Syphilis Serology, [place] Lab - HIV/HCV [place] [facility] - Chlamydia/Gonorrhea [place] [facility]     No follow-ups on file.  No future appointments.  Alberteen Spindle, CNM

## 2020-01-28 LAB — GONOCOCCUS CULTURE

## 2020-01-30 LAB — HM HEPATITIS C SCREENING LAB: HM Hepatitis Screen: NEGATIVE

## 2020-01-30 LAB — HM HIV SCREENING LAB: HM HIV Screening: NEGATIVE

## 2020-03-10 ENCOUNTER — Ambulatory Visit: Payer: Medicaid Other

## 2020-03-20 ENCOUNTER — Ambulatory Visit: Payer: Medicaid Other | Admitting: Family Medicine

## 2020-03-20 ENCOUNTER — Encounter: Payer: Self-pay | Admitting: Family Medicine

## 2020-03-20 ENCOUNTER — Other Ambulatory Visit: Payer: Self-pay

## 2020-03-20 DIAGNOSIS — Z3202 Encounter for pregnancy test, result negative: Secondary | ICD-10-CM

## 2020-03-20 DIAGNOSIS — Z113 Encounter for screening for infections with a predominantly sexual mode of transmission: Secondary | ICD-10-CM

## 2020-03-20 DIAGNOSIS — Z202 Contact with and (suspected) exposure to infections with a predominantly sexual mode of transmission: Secondary | ICD-10-CM

## 2020-03-20 DIAGNOSIS — Z3200 Encounter for pregnancy test, result unknown: Secondary | ICD-10-CM

## 2020-03-20 LAB — PREGNANCY, URINE: Preg Test, Ur: NEGATIVE

## 2020-03-20 LAB — WET PREP FOR TRICH, YEAST, CLUE
Trichomonas Exam: NEGATIVE
Yeast Exam: NEGATIVE

## 2020-03-20 MED ORDER — METRONIDAZOLE 500 MG PO TABS: 500.0000 mg | ORAL_TABLET | Freq: Two times a day (BID) | ORAL | 0 refills | Status: AC

## 2020-03-20 NOTE — Progress Notes (Signed)
Wet mount reviewed, pt treated with Metronidazole per provider orders. Provider orders completed.

## 2020-03-20 NOTE — Progress Notes (Signed)
  [facility] Department STI clinic/screening visit  Subjective:  Misty Robertson is a 25 y.o. female being seen today for an STI screening visit. The patient reports they do not have symptoms.  Patient reports that they do not desire a pregnancy in the next year.   They reported they are not interested in discussing contraception today.  Patient's last menstrual period was 10/14/2019.   Patient has the following medical conditions:   Patient Active Problem List   Diagnosis Date Noted  . Obesity BMI=30.2 09/02/2019  . Marijuana abuse daily 09/02/2019  . Anxiety dx'd 2014 09/27/2018  . 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 SEXUALLY TRANSMITTED DISEASE    HPI  Patient reports here as contact to Trich.  Was told by partner that he tested positive.  Denies s/sx except for lower abdominal pain.    Last HIV test per patient/review of record was 02/10/2020 Patient reports last pap was 08/2019  See flowsheet for further details and programmatic requirements.    The following portions of the patient's history were reviewed and updated as appropriate: allergies, current medications, past medical history, past social history, past surgical history and problem list.  Objective:  There were no vitals filed for this visit.  Physical Exam Genitourinary:    Comments: Patient declined exam, self collect  Skin:    General: Skin is warm and dry.  Neurological:     Mental Status: She is alert and oriented to person, place, and time.  Psychiatric:        Mood and Affect: Mood normal.        Behavior: Behavior normal.      Assessment and Plan:  Misty Robertson is a 25 y.o. female presenting to the [facility] Department for STI screening  1. Screening examination for venereal disease  - Chlamydia/Gonorrhea [place] [facility] - HBV Antigen/Antibody [place] Lab - HIV/HCV [place] [facility] - Syphilis Serology, [place]  Lab - WET PREP FOR TRICH, YEAST, CLUE   2. Encounter for pregnancy test, result unknown Patient requesting PT today.  Reports LMP was 09/2019.   - Pregnancy, urine  Patient accepted all screenings including oral, vaginal CT/GC and bloodwork for HIV/RPR/HCV/HBV Patient meets criteria for HepB screening? Yes. Ordered? Yes Patient meets criteria for HepC screening? Yes. Ordered? Yes    Treatment needed as contact for Trich with Metronidazole 500 mg PO BID X 7 days.    Discussed time line for State Lab results and that patient will be called with positive results and encouraged patient to call if she had not heard in 2 weeks.   Counseled to return or seek care for continued or worsening symptoms Recommended condom use with all sex  Patient is currently using condoms to prevent pregnancy.      No follow-ups on file.  No future appointments.  Misty Snipes, FNP

## 2020-03-25 LAB — HEPATITIS B SURFACE ANTIGEN

## 2020-03-25 LAB — HM HEPATITIS C SCREENING LAB: HM Hepatitis Screen: NEGATIVE

## 2020-03-25 LAB — HM HIV SCREENING LAB: HM HIV Screening: NEGATIVE

## 2020-03-26 LAB — GONOCOCCUS CULTURE

## 2020-06-09 ENCOUNTER — Other Ambulatory Visit: Payer: Self-pay

## 2020-06-09 ENCOUNTER — Ambulatory Visit: Payer: Self-pay | Admitting: Family Medicine

## 2020-06-09 DIAGNOSIS — B9689 Other specified bacterial agents as the cause of diseases classified elsewhere: Secondary | ICD-10-CM

## 2020-06-09 DIAGNOSIS — N760 Acute vaginitis: Secondary | ICD-10-CM

## 2020-06-09 DIAGNOSIS — Z113 Encounter for screening for infections with a predominantly sexual mode of transmission: Secondary | ICD-10-CM

## 2020-06-09 LAB — WET PREP FOR TRICH, YEAST, CLUE
Trichomonas Exam: NEGATIVE
Yeast Exam: NEGATIVE

## 2020-06-09 MED ORDER — METRONIDAZOLE 500 MG PO TABS: 500.0000 mg | ORAL_TABLET | Freq: Two times a day (BID) | ORAL | 0 refills | Status: DC

## 2020-06-09 NOTE — Progress Notes (Signed)
Wet mount results reviewed with Provider. Pt treated per Provider orders. Tyronda Vizcarrondo M Davine Sweney, RN  

## 2020-06-09 NOTE — Progress Notes (Signed)
[facility] Department STI clinic/screening visit  Subjective:  JAZLYN TIPPENS is a 25 y.o. female being seen today for an STI screening visit. The patient reports they do not have symptoms.  Patient reports that they do not desire a pregnancy in the next year.   They reported they are not interested in discussing contraception today.  Patient's last menstrual period was 05/16/2020.   Patient has the following medical conditions:   Patient Active Problem List   Diagnosis Date Noted  . Obesity BMI=30.2 09/02/2019  . Marijuana abuse daily 09/02/2019  . Anxiety dx'd 2014 09/27/2018  . 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 SEXUALLY TRANSMITTED DISEASE    screening    HPI  Patient reports here for screening, denies any s/sx.   Last HIV test per patient/review of record was 03/2020 Patient reports last pap was 09/02/2019  See flowsheet for further details and programmatic requirements.    The following portions of the patient's history were reviewed and updated as appropriate: allergies, current medications, past medical history, past social history, past surgical history and problem list.  Objective:  There were no vitals filed for this visit.  Physical Exam Vitals and nursing note reviewed.  Constitutional:      Appearance: Normal appearance.  HENT:     Head: Normocephalic and atraumatic.     Mouth/Throat:     Mouth: Mucous membranes are moist.     Pharynx: Oropharynx is clear. No oropharyngeal exudate or posterior oropharyngeal erythema.  Pulmonary:     Effort: Pulmonary effort is normal.  Chest:  Breasts:     Right: No axillary adenopathy or supraclavicular adenopathy.     Left: No axillary adenopathy or supraclavicular adenopathy.    Abdominal:     General: Abdomen is flat.     Palpations: There is no mass.     Tenderness: There is no abdominal tenderness. There is no rebound.  Genitourinary:    Exam  position: Lithotomy position.     Pubic Area: No rash or pubic lice.      Labia:        Right: No rash or lesion.        Left: No rash or lesion.      Vagina: Normal. No vaginal discharge, erythema, bleeding or lesions.     Cervix: No cervical motion tenderness, discharge, friability, lesion or erythema.     Uterus: Normal.      Adnexa: Right adnexa normal and left adnexa normal.     Comments: Deferred patient self collected  Lymphadenopathy:     Head:     Right side of head: No preauricular or posterior auricular adenopathy.     Left side of head: No preauricular or posterior auricular adenopathy.     Cervical: No cervical adenopathy.     Upper Body:     Right upper body: No supraclavicular or axillary adenopathy.     Left upper body: No supraclavicular or axillary adenopathy.     Lower Body: No right inguinal adenopathy. No left inguinal adenopathy.  Skin:    General: Skin is warm and dry.     Findings: No rash.  Neurological:     Mental Status: She is alert and oriented to person, place, and time.      Assessment and Plan:  SARAHLYNN CISNERO is a 25 y.o. female presenting to the [facility] Department for STI screening  1. Screening examination for  venereal disease - Chlamydia/Gonorrhea [place] [facility] - HIV [place] [facility] - Syphilis Serology, [place] [facility] - WET PREP FOR TRICH, YEAST, CLUE - Chlamydia/Gonorrhea [place] [facility]   Patient accepted all screenings including oral, vaginal CT/GC and bloodwork for HIV/RPR.  Patient meets criteria for HepB screening? Yes. Ordered? No - negative result in the last 6 months  Patient meets criteria for HepC screening? Yes. Ordered? No - negative in last 6 months   Wet prep results positive amine and clue.    2. BV (bacterial vaginosis) Treat patient for BV with Metronidazole 500 mg PO BID x 7 days.   Discussed time line for State Lab results and that patient will be called with positive results and encouraged patient to  call if she had not heard in 2 weeks.  Counseled to return or seek care for continued or worsening symptoms Recommended condom use with all sex  Patient is currently using condoms to prevent pregnancy.       Return for as needed.  No future appointments.  Wendi Snipes, FNP

## 2020-08-14 ENCOUNTER — Ambulatory Visit: Payer: Medicaid Other

## 2020-08-17 ENCOUNTER — Other Ambulatory Visit: Payer: Self-pay

## 2020-08-17 ENCOUNTER — Ambulatory Visit: Payer: Medicaid Other

## 2020-08-17 ENCOUNTER — Ambulatory Visit: Payer: Medicaid Other | Admitting: Physician Assistant

## 2020-08-17 DIAGNOSIS — Z5321 Procedure and treatment not carried out due to patient leaving prior to being seen by health care provider: Secondary | ICD-10-CM

## 2020-08-17 NOTE — Progress Notes (Signed)
Patient with an appointment at 2 pm.  Put in at 1:40 pm as present/signed in and checked in at 2:04 pm.  I marked patient as arrived at 2:10 pm when I saw that she had been marked as signed in and checked in.  Went to lobby to call patient back for visit and lobby was empty.  Spoke with Rojelio Brenner, and she stated that she had called patient several times but no one answered her call and so she is not sure if the patient ever was here at all or was here and had to leave prior to being called to check in.  Patient not seen today.

## 2020-10-01 ENCOUNTER — Ambulatory Visit: Payer: Self-pay | Admitting: Family Medicine

## 2020-10-01 ENCOUNTER — Encounter: Payer: Self-pay | Admitting: Family Medicine

## 2020-10-01 ENCOUNTER — Other Ambulatory Visit: Payer: Self-pay

## 2020-10-01 ENCOUNTER — Ambulatory Visit: Payer: Medicaid Other

## 2020-10-01 DIAGNOSIS — Z124 Encounter for screening for malignant neoplasm of cervix: Secondary | ICD-10-CM

## 2020-10-01 DIAGNOSIS — Z3009 Encounter for other general counseling and advice on contraception: Secondary | ICD-10-CM

## 2020-10-01 DIAGNOSIS — Z113 Encounter for screening for infections with a predominantly sexual mode of transmission: Secondary | ICD-10-CM

## 2020-10-01 LAB — WET PREP FOR TRICH, YEAST, CLUE
Trichomonas Exam: NEGATIVE
Yeast Exam: NEGATIVE

## 2020-10-01 NOTE — Progress Notes (Signed)
[facility] Department STI clinic/screening visit  Subjective:  Misty Robertson is a 25 y.o. female being seen today for an STI screening visit. The patient reports they do not have symptoms.  Patient reports that they do not desire a pregnancy in the next year.   They reported they are not interested in discussing contraception today.  Patient's last menstrual period was 09/15/2020.   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   Last HIV test per patient/review of record was April 2022 Patient reports last pap was never and would like to have this performed today.   See flowsheet for further details and programmatic requirements.    The following portions of the patient's history were reviewed and updated as appropriate: allergies, current medications, past medical history, past social history, past surgical history and problem list.  Objective:  There were no vitals filed for this visit.  Physical Exam Vitals and nursing note reviewed.  Constitutional:      Appearance: Normal appearance.  HENT:     Head: Normocephalic and atraumatic.     Mouth/Throat:     Mouth: Mucous membranes are moist.     Pharynx: Oropharynx is clear. No oropharyngeal exudate or posterior oropharyngeal erythema.  Pulmonary:     Effort: Pulmonary effort is normal.  Abdominal:     General: Abdomen is flat.     Palpations: There is no mass.     Tenderness: There is no abdominal tenderness. There is no rebound.  Genitourinary:    General: Normal vulva.     Exam position: Lithotomy position.     Pubic Area: No rash or pubic lice.      Labia:         Right: No rash or lesion.        Left: No rash or lesion.      Vagina: Normal. No vaginal discharge (ph<4.5), erythema, bleeding or lesions.     Cervix: No cervical motion tenderness, discharge, friability, lesion or erythema.     Uterus: Normal.      Adnexa: Right adnexa normal and left adnexa normal.     Rectum: Normal.  Lymphadenopathy:     Head:     Right side of head: No preauricular or posterior auricular adenopathy.     Left side of head: No preauricular or posterior auricular adenopathy.     Cervical: No cervical adenopathy.     Upper Body:     Right upper body: No supraclavicular or axillary adenopathy.     Left upper body: No supraclavicular or axillary adenopathy.     Lower Body: No right inguinal adenopathy. No left inguinal adenopathy.  Skin:    General: Skin is warm and dry.     Findings: No rash.  Neurological:     Mental Status: She is alert and oriented to person, place, and time.     Assessment and Plan:  Misty Robertson is a 25 y.o. female presenting to the [facility] Department for STI screening  1. Screening examination for venereal disease Patient accepted all screenings including vaginal CT/GC and bloodwork for HIV/RPR- not  eligible for oral GC.  Patient meets criteria for HepB screening? Yes. Ordered? No - no change in rf and screening within last 8 months Patient meets criteria for HepC screening? Yes. Ordered? No - screening with in last 8 months  Treat wet prep per standing order Discussed time line for State Lab results and that patient will be called with positive results and encouraged patient to call if she had not heard in 2 weeks.  Counseled to return or seek care for continued or worsening symptoms Recommended condom use with all sex  Patient is currently using  nothing  to prevent pregnancy. Does not want to discuss today. Reviewed available FP services. Stated reproductive life plan does not align with her actions to  prevent pregnancy.  - WET PREP FOR TRICH, YEAST, CLUE - HIV [place] [facility] - Syphilis Serology, [place] Lab - Chlamydia/Gonorrhea [place] [facility]  2. Cervical cancer screening Patient agreed to [facility] services today in light of already performing her pelvic  exam.  - Pap IG (Image Guided)  3. Family planning - discussed risk of pregnancy and patient clearly states she does not desire pregnancy but also does not want a method. Reviewed options in a tiered fashion.   Return Family planning services.  No future appointments.  Federico Flake, MD

## 2020-10-04 LAB — PAP IG (IMAGE GUIDED): PAP Smear Comment: 0

## 2020-12-16 ENCOUNTER — Ambulatory Visit: Payer: Medicaid Other

## 2020-12-17 ENCOUNTER — Other Ambulatory Visit: Payer: Self-pay

## 2020-12-17 ENCOUNTER — Encounter: Payer: Self-pay | Admitting: Advanced Practice Midwife

## 2020-12-17 ENCOUNTER — Ambulatory Visit: Payer: Self-pay | Admitting: Advanced Practice Midwife

## 2020-12-17 DIAGNOSIS — Z720 Tobacco use: Secondary | ICD-10-CM | POA: Insufficient documentation

## 2020-12-17 DIAGNOSIS — Z113 Encounter for screening for infections with a predominantly sexual mode of transmission: Secondary | ICD-10-CM

## 2020-12-17 LAB — HM HEPATITIS C SCREENING LAB: HM Hepatitis Screen: NEGATIVE

## 2020-12-17 LAB — WET PREP FOR TRICH, YEAST, CLUE
Trichomonas Exam: NEGATIVE
Yeast Exam: NEGATIVE

## 2020-12-17 LAB — HM HIV SCREENING LAB: HM HIV Screening: NEGATIVE

## 2020-12-17 NOTE — Progress Notes (Signed)
Pt here for STD screening.  Wet mount results reviewed, no treatment required.  Pt declined condoms. Jonerik Sliker M Danee Soller, RN  

## 2020-12-17 NOTE — Progress Notes (Signed)
[facility] Department STI clinic/screening visit  Subjective:  Misty Robertson is a 25 y.o. SBF G2P0 vaper female being seen today for an STI screening visit. The patient reports they do not have symptoms.  Patient reports that they do not desire a pregnancy in the next year.   They reported they are not interested in discussing contraception today.  Patient's last menstrual period was 12/16/2020 (exact date).   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  LMP yesterday 12/16/20. Last sex 12/07/20 without condom; with current partner x 6 mo; 3 sex partners in last 3 mo. Last MJ yesterday. Last cigar 8 years ago. Current vaper. Last ETOH yesterday (6 mixed drinks) q weekend.   Last HIV test per patient/review of record was 10/01/20 Patient reports last pap was 10/01/20 neg  See flowsheet for further details and programmatic requirements.    The following portions of the patient's history were reviewed and updated as appropriate: allergies, current medications, past medical history, past social history, past surgical history and problem list.  Objective:  There were no vitals filed for this visit.  Physical Exam Vitals and nursing note reviewed.  Constitutional:      Appearance: Normal appearance.  HENT:     Head: Normocephalic and atraumatic.     Mouth/Throat:     Mouth: Mucous membranes are moist.     Pharynx: Oropharynx is clear. No oropharyngeal exudate or posterior oropharyngeal erythema.  Eyes:     Conjunctiva/sclera: Conjunctivae normal.  Pulmonary:     Effort: Pulmonary effort is normal.   Abdominal:     General: Abdomen is flat.     Palpations: Abdomen is soft. There is no mass.     Tenderness: There is no abdominal tenderness. There is no rebound.     Comments: Soft, good tone, without masses or tenderness  Genitourinary:    Pubic Area: No rash or pubic lice.      Labia:        Right: No rash or lesion.        Left: No rash or lesion.      Vagina: No vaginal discharge, erythema, bleeding or lesions.     Cervix: No cervical motion tenderness, discharge, friability, lesion or erythema.     Comments: Pt prefers to self collect specimens Lymphadenopathy:     Head:     Right side of head: No preauricular or posterior auricular adenopathy.     Left side of head: No preauricular or posterior auricular adenopathy.     Cervical: No cervical adenopathy.     Right cervical: No superficial, deep or posterior cervical adenopathy.    Left cervical: No superficial, deep or posterior cervical adenopathy.     Upper Body:     Right upper body: No supraclavicular or axillary adenopathy.     Left upper body: No supraclavicular or axillary adenopathy.     Lower Body: No right inguinal adenopathy. No left inguinal adenopathy.  Skin:    General: Skin is warm and dry.     Findings: No rash.  Neurological:     Mental Status:  She is alert and oriented to person, place, and time.     Assessment and Plan:  Misty Robertson is a 25 y.o. female presenting to the [facility] Department for STI screening  1. Screening examination for venereal disease Treat wet mount per standing orders Immunization nurse consult - WET PREP FOR TRICH, YEAST, CLUE - Syphilis Serology, [place] Lab - Chlamydia/Gonorrhea [place] Lab - HIV/HCV  Lab - Gonococcus culture  2. Vapes nicotine containing substance Please give 1800 quit # to pt     No follow-ups on file.  No future appointments.  Alberteen Spindle, CNM

## 2020-12-21 LAB — GONOCOCCUS CULTURE

## 2020-12-22 NOTE — Progress Notes (Signed)
PAP Normal.  Repeat PAP in 3 years (09-2023) per Lyndel Safe, MD.  MyChart message sent to patient regarding PAP results on 12/14/2020 by Lyndel Safe, MD.  Last login by patient was 12/14/2020.  Routed 12/14/2020. Hart Carwin, RN

## 2021-03-17 ENCOUNTER — Ambulatory Visit: Payer: Medicaid Other

## 2021-03-22 ENCOUNTER — Other Ambulatory Visit: Payer: Self-pay

## 2021-03-22 ENCOUNTER — Ambulatory Visit: Payer: Self-pay | Admitting: Advanced Practice Midwife

## 2021-03-22 ENCOUNTER — Encounter: Payer: Self-pay | Admitting: Advanced Practice Midwife

## 2021-03-22 DIAGNOSIS — Z113 Encounter for screening for infections with a predominantly sexual mode of transmission: Secondary | ICD-10-CM

## 2021-03-22 LAB — WET PREP FOR TRICH, YEAST, CLUE
Trichomonas Exam: NEGATIVE
Yeast Exam: NEGATIVE

## 2021-03-22 NOTE — Progress Notes (Signed)
[facility] Department  STI clinic/screening visit [place] [address] [phone]  Subjective:  Misty Robertson is a 26 y.o. SBF vaping G2P0  female being seen today for an STI screening visit. The patient reports they do not have symptoms.  Patient reports that they do not desire a pregnancy in the next year.   They reported they are not interested in discussing contraception today.    Patient's last menstrual period was 03/14/2021 (exact date).   Patient has the following medical conditions:   Patient Active Problem List   Diagnosis Date Noted   Vapes nicotine containing substance 12/17/2020   Obesity BMI=30.2 09/02/2019   Marijuana abuse daily 09/02/2019   Anxiety dx'd 2014 09/27/2018   Hemoglobin S trait ([place]) 09/23/2004    Chief Complaint  Patient presents with   SEXUALLY TRANSMITTED DISEASE    HPI  Patient reports asymptomatic. LMP 03/14/21. Last sex 03/14/21 without condom; with current partner x 7 mo; 2 partners in last 3 mo. Last vaped 2 days ago. Last cigar age 80. Last MJ yesterday. Last ETOH 03/20/21 (1 strawberry margarita) q weekend.   Last HIV test per patient/review of record was 12/17/20 Patient reports last pap was 10/01/20 neg  Screening for MPX risk: Does the patient have an unexplained rash? No Is the patient MSM? No Does the patient endorse multiple sex partners or anonymous sex partners? No Did the patient have close or sexual contact with a person diagnosed with MPX? No Has the patient traveled outside the [place] where MPX is endemic? No Is there a high clinical suspicion for MPX-- evidenced by one of the following No  -Unlikely to be chickenpox  -Lymphadenopathy  -Rash that present in same phase of evolution on any given body part See flowsheet for further details and programmatic requirements.    The following portions of the patient's history were reviewed and updated as appropriate: allergies, current  medications, past medical history, past social history, past surgical history and problem list.  Objective:  There were no vitals filed for this visit.  Physical Exam Vitals and nursing note reviewed.  Constitutional:      Appearance: Normal appearance. She is normal weight.  HENT:     Head: Normocephalic and atraumatic.     Mouth/Throat:     Mouth: Mucous membranes are moist.     Pharynx: Oropharynx is clear. No oropharyngeal exudate or posterior oropharyngeal erythema.  Pulmonary:     Effort: Pulmonary effort is normal.  Abdominal:     General: Abdomen is flat.     Palpations: Abdomen is soft. There is no mass.     Tenderness: There is no abdominal tenderness. There is no rebound.     Comments: Soft without masses or tenderness, good tone  Genitourinary:    General: Normal vulva.     Exam position: Lithotomy position.     Pubic Area: No rash or pubic lice.      Labia:        Right: No rash or lesion.        Left: No rash or lesion.      Vagina: Vaginal discharge (white creamy leukorrhea, ph<4.5) present. No erythema, bleeding or lesions.     Cervix: Normal.     Uterus: Normal.      Adnexa: Right adnexa normal and left adnexa normal.     Rectum: Normal.  Lymphadenopathy:     Head:     Right side of head: No  preauricular or posterior auricular adenopathy.     Left side of head: No preauricular or posterior auricular adenopathy.     Cervical: No cervical adenopathy.     Right cervical: No superficial, deep or posterior cervical adenopathy.    Left cervical: No superficial, deep or posterior cervical adenopathy.     Upper Body:     Right upper body: No supraclavicular or axillary adenopathy.     Left upper body: No supraclavicular or axillary adenopathy.     Lower Body: No right inguinal adenopathy. No left inguinal adenopathy.  Skin:    General: Skin is warm and dry.     Findings: No rash.  Neurological:     Mental Status: She is alert and oriented to person, place, and  time.     Assessment and Plan:  MANNA ROCHETTE is a 26 y.o. female presenting to the [facility] Department for STI screening  1. Screening examination for venereal disease Treat wet mount per standing orders Immunization nurse consult - WET PREP FOR [place], YEAST, CLUE - Gonococcus culture - Chlamydia/Gonorrhea [place] [facility]     No follow-ups on file.  No future appointments.  Herbie Saxon, CNM

## 2021-03-22 NOTE — Progress Notes (Signed)
Client here for STD testing. States she doesn't want bloodwork today, had last 12/17/20.Marland KitchenBurt Knack, RN

## 2021-03-22 NOTE — Progress Notes (Signed)
Wet prep results reviewed by E. Sciora CNM and no treatment indicated. Jossie Ng, RN

## 2021-03-27 LAB — GONOCOCCUS CULTURE

## 2021-07-22 ENCOUNTER — Encounter: Payer: Self-pay | Admitting: Nurse Practitioner

## 2021-07-22 ENCOUNTER — Ambulatory Visit: Payer: Self-pay | Admitting: Nurse Practitioner

## 2021-07-22 DIAGNOSIS — Z113 Encounter for screening for infections with a predominantly sexual mode of transmission: Secondary | ICD-10-CM

## 2021-07-22 DIAGNOSIS — F32A Depression, unspecified: Secondary | ICD-10-CM

## 2021-07-22 DIAGNOSIS — F419 Anxiety disorder, unspecified: Secondary | ICD-10-CM

## 2021-07-22 DIAGNOSIS — B9689 Other specified bacterial agents as the cause of diseases classified elsewhere: Secondary | ICD-10-CM

## 2021-07-22 LAB — WET PREP FOR TRICH, YEAST, CLUE
Trichomonas Exam: NEGATIVE
Yeast Exam: NEGATIVE

## 2021-07-22 LAB — HM HIV SCREENING LAB: HM HIV Screening: NEGATIVE

## 2021-07-22 LAB — HM HEPATITIS C SCREENING LAB: HM Hepatitis Screen: NEGATIVE

## 2021-07-22 MED ORDER — METRONIDAZOLE 500 MG PO TABS: 500.0000 mg | ORAL_TABLET | Freq: Two times a day (BID) | ORAL | 0 refills | Status: AC

## 2021-07-22 NOTE — Progress Notes (Signed)
Pt here for STD screening.  Wet mount results reviewed.  The patient was dispensed Metronidazole 500 mg today. I provided counseling today regarding the medication. We discussed the medication, the side effects and when to call clinic. Patient given the opportunity to ask questions. Questions answered. Condoms declined.  Marlenne Ridge M Airon Sahni, RN   

## 2021-07-22 NOTE — Progress Notes (Signed)
[facility] Department  STI clinic/screening visit [address] [place] [address] [phone]  Subjective:  Misty Robertson is a 26 y.o. female being seen today for an STI screening visit. The patient reports they do not have symptoms.  Patient reports that they do not desire a pregnancy in the next year.   They reported they are not interested in discussing contraception today.    Patient's last menstrual period was 07/03/2021.   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 Denies sx    HPI  Patient reports to clinic for STD screening.  Patient is asymptomatic.    Last HIV test per patient/review of record was 12/17/2020. Patient reports last pap was 10/01/2020.   Screening for MPX risk: Does the patient have an unexplained rash? No Is the patient MSM? No Does the patient endorse multiple sex partners or anonymous sex partners? No Did the patient have close or sexual contact with a person diagnosed with MPX? No Has the patient traveled outside the [place] where MPX is endemic? No Is there a high clinical suspicion for MPX-- evidenced by one of the following No  -Unlikely to be chickenpox  -Lymphadenopathy  -Rash that present in same phase of evolution on any given body part See flowsheet for further details and programmatic requirements.   Immunization history:  Immunization History  Administered Date(s) Administered   Hepatitis A 03/20/2009, 04/26/2011   Hepatitis B 1996[birth year], 03/27/1996, 10/01/1996   Hpv-Unspecified 04/26/2011, 10/10/2012   Td 12/21/2017   Tdap 10/15/2007     The following portions of the  patient's history were reviewed and updated as appropriate: allergies, current medications, past medical history, past social history, past surgical history and problem list.  Objective:  There were no vitals filed for this visit.  Physical Exam Constitutional:      Appearance: Normal appearance.  HENT:     Head: Normocephalic.     Right Ear: External ear normal.     Left Ear: External ear normal.     Nose: Nose normal.     Mouth/Throat:     Lips: Pink.     Mouth: Mucous membranes are moist.     Comments: No visible signs of dental caries  Pulmonary:     Effort: Pulmonary effort is normal.  Abdominal:     General: Abdomen is flat.     Palpations: Abdomen is soft.  Genitourinary:    Comments: Deferred, patient desires to self collect  Musculoskeletal:     Cervical back: Full passive range of motion without pain, normal range of motion and neck supple.  Skin:    General: Skin is warm and dry.  Neurological:     Mental Status: She is alert and oriented to person, place, and time.  Psychiatric:        Attention and Perception: Attention normal.        Mood and Affect: Mood normal.        Speech: Speech normal.        Behavior: Behavior normal. Behavior is cooperative.     Assessment and Plan:  Misty Robertson is a 26 y.o. female  presenting to the [facility] Department for STI screening  1. Screening examination for venereal disease -26 year old female in clinic today for STD screening. -Patient accepted all screenings including oral GC, wet prep, vaginal CT/GC and bloodwork for HIV/RPR.  Patient meets criteria for HepB screening? Yes. Ordered? Yes Patient meets criteria for HepC screening? Yes. Ordered? Yes  Treat wet prep per standing order Discussed time line for State [facility] results and that patient will be called with positive results and encouraged patient to call if she had not heard in 2 weeks.  Counseled to return or seek care for continued or  worsening symptoms Recommended condom use with all sex  Patient is currently using  condoms  to prevent pregnancy.    - Chlamydia/Gonorrhea [place] [facility] - HIV/HCV [place] [facility] - Syphilis Serology,  Lab - HBV Antigen/Antibody [facility] Lab - WET PREP FOR [place], YEAST, CLUE - Gonococcus culture  2. Bacterial vaginosis -Wet mount reviewed.  Treat patient for BV.   - metroNIDAZOLE (FLAGYL) 500 MG tablet; Take 1 tablet (500 mg total) by mouth 2 (two) times daily for 7 days.  Dispense: 14 tablet; Refill: 0  3. Anxiety -Patient report history of anxiety and depression.  Behavioral Health referral submitted.   - Ambulatory referral to Behavioral Health  4. Depression, unspecified depression type -Patient report history of anxiety and depression.  Behavioral Health referral submitted.   - Ambulatory referral to [facility]     Return if symptoms worsen or fail to improve.    Glenna Fellows, FNP

## 2021-07-26 LAB — GONOCOCCUS CULTURE

## 2021-11-02 ENCOUNTER — Other Ambulatory Visit: Payer: Self-pay | Admitting: Family Medicine

## 2021-11-02 DIAGNOSIS — N6312 Unspecified lump in the right breast, upper inner quadrant: Secondary | ICD-10-CM

## 2021-11-02 DIAGNOSIS — N644 Mastodynia: Secondary | ICD-10-CM

## 2021-11-03 ENCOUNTER — Ambulatory Visit
Admission: RE | Admit: 2021-11-03 | Discharge: 2021-11-03 | Disposition: A | Discharge status: Home / Self Care | Payer: 59 | Source: Ambulatory Visit | Attending: Family Medicine | Admitting: Family Medicine

## 2021-11-03 DIAGNOSIS — N6312 Unspecified lump in the right breast, upper inner quadrant: Secondary | ICD-10-CM | POA: Diagnosis present

## 2021-11-03 DIAGNOSIS — N644 Mastodynia: Secondary | ICD-10-CM | POA: Diagnosis present

## 2021-12-14 ENCOUNTER — Ambulatory Visit: Payer: Self-pay | Admitting: Advanced Practice Midwife

## 2021-12-14 ENCOUNTER — Encounter: Payer: Self-pay | Admitting: Advanced Practice Midwife

## 2021-12-14 DIAGNOSIS — Z113 Encounter for screening for infections with a predominantly sexual mode of transmission: Secondary | ICD-10-CM

## 2021-12-14 LAB — WET PREP FOR TRICH, YEAST, CLUE
Trichomonas Exam: NEGATIVE
Yeast Exam: NEGATIVE

## 2021-12-14 LAB — HM HIV SCREENING LAB: HM HIV Screening: NEGATIVE

## 2021-12-14 NOTE — Progress Notes (Signed)
[facility] Department  STI clinic/screening visit [place] [address] [phone]  Subjective:  Misty Robertson is a 26 y.o. SBF G2P0 vaper female being seen today for an STI screening visit. The patient reports they do not have symptoms.  Patient reports that they do not desire a pregnancy in the next year.   They reported they are not interested in discussing contraception today.    Patient's last menstrual period was 11/24/2021 (exact date).   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 LMP 11/24/21. Last sex 12/13/21 without condom; with current partner x 2 mo; 2 sex partners in last 3 mo. Vaping and cigars daily. MJ daily. Last ETOH 12/12/21 (2 Margaritas) q weekend.   Does the patient using douching products? No  Last HIV test per patient/review of record was  Lab Results  Component Value Date   HMHIVSCREEN Negative - Validated 07/22/2021   No results found for: "HIV" Patient reports last pap was No results found for: "DIAGPAP"   Screening for MPX risk: Does the patient have an unexplained rash? No Is the patient MSM? No Does the patient endorse multiple sex partners or anonymous sex partners? Yes Did the patient have close or sexual contact with a person diagnosed with MPX? No Has the patient traveled outside the [place] where MPX is endemic? No Is there a high clinical suspicion for MPX-- evidenced by one of the following No  -Unlikely to be chickenpox  -Lymphadenopathy  -Rash that present in same phase of evolution on any given body part See flowsheet for  further details and programmatic requirements.   Immunization history:  Immunization History  Administered Date(s) Administered   Hepatitis A 03/20/2009, 04/26/2011   Hepatitis B [birth year] 1997, 03/27/1996, 10/01/1996   Hpv-Unspecified 04/26/2011, 10/10/2012   MMR 10/01/1996, 02/25/1997, 03/02/2000   Meningococcal Conjugate 03/20/2009   Meningococcal Mcv4o 10/10/2012   Td 12/21/2017   Tdap 10/15/2007   Varicella 01/14/2004, 03/20/2009     The following portions of the patient's history were reviewed and updated as appropriate: allergies, current medications, past medical history, past social history, past surgical history and problem list.  Objective:  There were no vitals filed for this visit.  Physical Exam Vitals and nursing note reviewed.  Constitutional:      Appearance: Normal appearance. She is normal weight.  HENT:     Head: Normocephalic and atraumatic.     Mouth/Throat:     Mouth: Mucous membranes are moist.     Pharynx: Oropharynx is clear. No oropharyngeal exudate or posterior oropharyngeal erythema.  Eyes:     Conjunctiva/sclera: Conjunctivae normal.  Pulmonary:     Effort: Pulmonary effort is normal.  Abdominal:     General: Abdomen is flat.     Palpations: Abdomen is soft. There is no mass.     Tenderness: There is no abdominal tenderness. There is no rebound.     Comments: Soft without masses or tenderness, good tone  Genitourinary:    General: Normal vulva.     Exam position: Lithotomy position.     Pubic Area: No rash or pubic  lice.      Labia:        Right: No rash or lesion.        Left: No rash or lesion.      Vagina: Vaginal discharge (grey sl malodorous leukorrhea, ph>4.5) present. No erythema, bleeding or lesions.     Cervix: Normal.     Uterus: Normal.      Adnexa: Right adnexa normal and left adnexa normal.     Rectum: Normal.     Comments: pH = >4.5 Lymphadenopathy:     Head:     Right side of head: No preauricular or posterior auricular  adenopathy.     Left side of head: No preauricular or posterior auricular adenopathy.     Cervical: No cervical adenopathy.     Right cervical: No superficial, deep or posterior cervical adenopathy.    Left cervical: No superficial, deep or posterior cervical adenopathy.     Upper Body:     Right upper body: No supraclavicular, axillary or epitrochlear adenopathy.     Left upper body: No supraclavicular, axillary or epitrochlear adenopathy.     Lower Body: No right inguinal adenopathy. No left inguinal adenopathy.  Skin:    General: Skin is warm and dry.     Findings: No rash.  Neurological:     Mental Status: She is alert and oriented to person, place, and time.      Assessment and Plan:  Misty Robertson is a 27 y.o. female presenting to the [facility] Department for STI screening  1. Screening examination for venereal disease Treat wet mount per standing orders Immunization nurse consult  - WET PREP FOR Misty Robertson, YEAST, [place] [facility] - Syphilis Serology, [place] [facility] - Gonococcus culture     No follow-ups on file.  No future appointments.  Herbie Saxon, CNM

## 2021-12-14 NOTE — Progress Notes (Signed)
Ola Spurr CNM reviewed wet prep results - no treatment indicated per Ms. Sciora CNM. Rich Number, RN

## 2021-12-19 LAB — GONOCOCCUS CULTURE

## 2022-03-14 ENCOUNTER — Ambulatory Visit (LOCAL_COMMUNITY_HEALTH_CENTER): Payer: Medicaid Other | Admitting: Family Medicine

## 2022-03-14 ENCOUNTER — Ambulatory Visit: Payer: Medicaid Other

## 2022-03-14 ENCOUNTER — Encounter: Payer: Self-pay | Admitting: Family Medicine

## 2022-03-14 VITALS — BP 103/58 | HR 72 | Ht 64.0 in | Wt 182.2 lb

## 2022-03-14 DIAGNOSIS — Z30013 Encounter for initial prescription of injectable contraceptive: Secondary | ICD-10-CM | POA: Diagnosis not present

## 2022-03-14 DIAGNOSIS — Z30019 Encounter for initial prescription of contraceptives, unspecified: Secondary | ICD-10-CM

## 2022-03-14 DIAGNOSIS — Z309 Encounter for contraceptive management, unspecified: Secondary | ICD-10-CM | POA: Diagnosis not present

## 2022-03-14 DIAGNOSIS — Z01419 Encounter for gynecological examination (general) (routine) without abnormal findings: Secondary | ICD-10-CM

## 2022-03-14 DIAGNOSIS — Z3202 Encounter for pregnancy test, result negative: Secondary | ICD-10-CM | POA: Diagnosis not present

## 2022-03-14 DIAGNOSIS — Z202 Contact with and (suspected) exposure to infections with a predominantly sexual mode of transmission: Secondary | ICD-10-CM

## 2022-03-14 LAB — WET PREP FOR TRICH, YEAST, CLUE
Trichomonas Exam: NEGATIVE
Yeast Exam: NEGATIVE

## 2022-03-14 LAB — HM HIV SCREENING LAB: HM HIV Screening: NEGATIVE

## 2022-03-14 LAB — PREGNANCY, URINE: Preg Test, Ur: NEGATIVE

## 2022-03-14 MED ORDER — MEDROXYPROGESTERONE ACETATE 150 MG/ML IM SUSP
150.0000 mg | INTRAMUSCULAR | Status: AC
  Administered 2022-03-14: 150 mg via INTRAMUSCULAR

## 2022-03-14 NOTE — Progress Notes (Signed)
Pt appointment initially for STI screening, but pt also wanted to start on BC. Visit changed to an acute family planning visit. BC counseling provided. See by FNP Lowella Petties. Initial lab results reviewed with pt. Depo administered per order.

## 2022-03-14 NOTE — Progress Notes (Signed)
[facility] [place] Main Number: [phone]  Family Planning Visit- Repeat Yearly Visit  Subjective:  Misty Robertson is a 27 y.o. G2P0020  being seen today for an annual wellness visit and to discuss contraception options.   The patient is currently using No Method - Other Reason for pregnancy prevention. Patient does not want a pregnancy in the next year.    report they are looking for a method that provides Ready when they are    Patient has the following medical problems: has Hemoglobin S trait ([place]); Anxiety dx'd 2014; Obesity BMI=30.2; Marijuana abuse daily; and Vapes nicotine containing substance on their problem list.  Chief Complaint  Patient presents with   Contraception    Wants to start on a birth control method   Exposure to STD    New partner    Patient reports to clinic for STD testing and OCPs  See flowsheet for other program required questions.   Body mass index is 31.27 kg/m. - Patient is eligible for diabetes screening based on BMI> 25 and age >40?  not applicable CX4G ordered? not applicable  Patient reports 1 of partners in last year. Desires STI screening?  Yes   Has patient been screened once for HCV in the past?  No  No results found for: "HCVAB"  Does the patient have current of drug use, have a partner with drug use, and/or has been incarcerated since last result? No  If yes-- Screen for HCV through [facility]   Does the patient meet criteria for HBV testing? No  Criteria:  -Household, sexual or needle sharing contact with HBV -History of drug use -HIV positive -Those with known Hep C   Health Maintenance Due  Topic Date Due   COVID-19 Vaccine (1) Never done   HPV VACCINES (3 - 3-dose series) 01/02/2013   INFLUENZA VACCINE  Never done    Review of Systems  Constitutional:  Negative for weight loss.  Eyes:  Negative for blurred vision.  Respiratory:  Negative  for cough and shortness of breath.   Cardiovascular:  Negative for claudication.  Gastrointestinal:  Negative for nausea.  Genitourinary:  Negative for dysuria and frequency.  Skin:  Negative for rash.  Neurological:  Negative for headaches.  Endo/Heme/Allergies:  Does not bruise/bleed easily.    The following portions of the patient's history were reviewed and updated as appropriate: allergies, current medications, past family history, past medical history, past social history, past surgical history and problem list. Problem list updated.  Objective:   Vitals:   03/14/22 1031  BP: (!) 103/58  Pulse: 72  Weight: 182 lb 3.2 oz (82.6 kg)  Height: 5\' 4"  (1.626 m)    Physical Exam Vitals and nursing note reviewed.  Constitutional:      Appearance: Normal appearance.  HENT:     Head: Normocephalic and atraumatic.     Mouth/Throat:     Mouth: Mucous membranes are moist.     Pharynx: Oropharynx is clear. No oropharyngeal exudate or posterior oropharyngeal erythema.  Pulmonary:     Effort: Pulmonary effort is normal.  Genitourinary:    General: Normal vulva.     Exam position: Lithotomy position.     Pubic Area: No rash or pubic lice.      Labia:        Right: No rash or lesion.        Left: No rash or lesion.  Vagina: Normal. No vaginal discharge, erythema, bleeding or lesions.     Cervix: No cervical motion tenderness, discharge, friability, lesion or erythema.     Uterus: Normal.      Adnexa: Right adnexa normal and left adnexa normal.     Rectum: Normal.     Comments: pH = 4 Lymphadenopathy:     Head:     Right side of head: No preauricular or posterior auricular adenopathy.     Left side of head: No preauricular or posterior auricular adenopathy.     Cervical: No cervical adenopathy.     Upper Body:     Right upper body: No supraclavicular, axillary or epitrochlear adenopathy.     Left upper body: No supraclavicular, axillary or epitrochlear adenopathy.     Lower  Body: No right inguinal adenopathy. No left inguinal adenopathy.  Skin:    General: Skin is warm and dry.     Findings: No rash.  Neurological:     Mental Status: She is alert and oriented to person, place, and time.  Psychiatric:        Mood and Affect: Mood normal.        Behavior: Behavior normal.       Assessment and Plan:  Misty Robertson is a 27 y.o. female [id number] presenting to the [facility] Department for an yearly wellness and contraception visit   Contraception counseling: Reviewed options based on patient desire and reproductive life plan. Patient is interested in Hormonal Injection. This was provided to the patient today.   Risks, benefits, and typical effectiveness rates were reviewed.  Questions were answered.  Written information was also given to the patient to review.    The patient will follow up in  3 months for surveillance.  The patient was told to call with any further questions, or with any concerns about this method of contraception.  Emphasized use of condoms 100% of the time for STI prevention.  Patient was assessed for need for ECP. Patient was offered ECP based on Unprotected sex within past 120 hours.  Patient is within 5 days of unprotected sex. Patient was offered ECP. Reviewed options and patient desired No method of ECP, declined all     1. Well woman exam with routine gynecological exam -last pap test 10/01/20- NIL, HPV criteria not met for testing, not due until 10/02/2023 -CBE- declined  2. Encounter for initial prescription of contraceptives, unspecified contraceptive Declined ECP today- last sex 5 days ago without a condom. Counseled to do a home PT in 2 weeks to verify.  - Pregnancy, urine - medroxyPROGESTERone (DEPO-PROVERA) injection 150 mg  3. Possible exposure to STD No symtoms- pt has a new partner  - Pregnancy, urine - HIV [place] [facility] - Syphilis Serology, [place] [facility] - Gonococcus culture - WET PREP FOR [place],  YEAST, [place] [facility]      Return if symptoms worsen or fail to improve.  No future appointments.  Sharlet Salina, [place]

## 2022-03-19 LAB — GONOCOCCUS CULTURE

## 2022-07-07 ENCOUNTER — Ambulatory Visit: Payer: Medicaid Other | Admitting: Family Medicine

## 2022-07-07 DIAGNOSIS — Z113 Encounter for screening for infections with a predominantly sexual mode of transmission: Secondary | ICD-10-CM

## 2022-07-07 LAB — WET PREP FOR TRICH, YEAST, CLUE
Trichomonas Exam: NEGATIVE
Yeast Exam: NEGATIVE

## 2022-07-07 NOTE — Progress Notes (Signed)
Pt here for STI screening.  Pt states she is asymptomatic.  Wet mount results reviewed and no treatment provided as per standing orders.  Condoms declined.-Collins Scotland, RN

## 2022-07-11 ENCOUNTER — Telehealth: Payer: Self-pay | Admitting: Family Medicine

## 2022-07-11 NOTE — Telephone Encounter (Signed)
Pt called stating that she needs a prescription because she said nurses weren't able to give her medication at her last appointment.

## 2022-07-11 NOTE — Telephone Encounter (Signed)
Pt was seen by an RN on 07/07/2023.  She had positive Clue and Amine and RN explained that she would need to be seen by a Provider for treatment.   Pt wanted a Provider to call in an Rx for the positive results.  Notified pt that she will have to see a Provider before any medication can be given.  Call sent to front desk for an appointment.  Berdie Ogren, RN

## 2022-07-26 ENCOUNTER — Ambulatory Visit: Payer: Medicaid Other | Admitting: Advanced Practice Midwife

## 2022-07-26 ENCOUNTER — Encounter: Payer: Self-pay | Admitting: Advanced Practice Midwife

## 2022-07-26 DIAGNOSIS — Z113 Encounter for screening for infections with a predominantly sexual mode of transmission: Secondary | ICD-10-CM

## 2022-07-26 LAB — WET PREP FOR TRICH, YEAST, CLUE
Trichomonas Exam: NEGATIVE
Yeast Exam: NEGATIVE

## 2022-07-26 NOTE — Progress Notes (Signed)
[facility] Department  STI clinic/screening visit [address] [place] [address] [phone]  Subjective:  Misty Robertson is a 27 y.o. SBF G2P0 vaper female being seen today for an STI screening visit. The patient reports they do not have symptoms.  Patient reports that they do not desire a pregnancy in the next year.   They reported they are not interested in discussing contraception today.    Patient's last menstrual period was 07/10/2022 (exact date).  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 LMP 07/10/22. Last sex 07/15/22 with condom; with current partner x 3 years; 2 partners in last 3 mo. Vapes. Last MJ 07/24/22. Last ETOH 07/23/22 (8 glasses liquor+15 shots liquor) qo day.  Last pap 10/01/20 neg  Does the patient using douching products? No  Last HIV test per patient/review of record was  Lab Results  Component Value Date   HMHIVSCREEN Negative - Validated 03/14/2022   No results found for: "HIV" Patient reports last pap was No results found for: "DIAGPAP"  Lab Results  Component Value Date   SPECADGYN Comment 10/01/2020    Screening for MPX risk: Does the patient have an unexplained rash? No Is the patient MSM? No Does the patient endorse multiple sex partners or anonymous sex partners? Yes Did the patient have close or sexual contact with a person diagnosed with MPX? No Has the patient traveled outside the [place] where MPX is endemic? No Is there a high clinical suspicion for MPX-- evidenced by one of the following  No  -Unlikely to be chickenpox  -Lymphadenopathy  -Rash that present in same phase of evolution on any given body part See flowsheet for further details and programmatic requirements.   Immunization history:  Immunization History  Administered Date(s) Administered   Hepatitis A 03/20/2009, 04/26/2011   Hepatitis B 1996[birth year], 03/27/1996, 10/01/1996   Hpv-Unspecified 04/26/2011, 10/10/2012   MMR 10/01/1996, 02/25/1997, 03/02/2000   Meningococcal Conjugate 03/20/2009   Meningococcal Mcv4o 10/10/2012   Td 12/21/2017   Tdap 10/15/2007   Varicella 01/14/2004, 03/20/2009     The following portions of the patient's history were reviewed and updated as appropriate: allergies, current medications, past medical history, past social history, past surgical history and problem list.  Objective:  There were no vitals filed for this visit.  Physical Exam Vitals and nursing note reviewed.  Constitutional:      Appearance: Normal appearance. Misty Robertson is normal weight.  HENT:     Head: Normocephalic and atraumatic.     Mouth/Throat:     Mouth: Mucous membranes are moist.     Pharynx: Oropharynx is clear. No oropharyngeal exudate or posterior oropharyngeal erythema.  Eyes:     Conjunctiva/sclera: Conjunctivae normal.  Pulmonary:     Effort: Pulmonary effort is normal.  Abdominal:     General: Abdomen is flat.     Palpations: Abdomen is soft. There is no mass.     Tenderness: There is no abdominal tenderness. There is no rebound.     Comments: Soft without masses or tenderness, good tone  Genitourinary:  General: Normal vulva.     Exam position: Lithotomy position.     Pubic Area: No rash or pubic lice.      Labia:        Right: No rash or lesion.        Left: No rash or lesion.      Vagina: Vaginal discharge (small amt white creamy leukorrhea, ph<4.5) present. No erythema, bleeding or lesions.     Cervix: Normal.     Uterus: Normal.      Adnexa: Right adnexa normal and left adnexa  normal.     Rectum: Normal.     Comments: pH = <4.5 Lymphadenopathy:     Head:     Right side of head: No preauricular or posterior auricular adenopathy.     Left side of head: No preauricular or posterior auricular adenopathy.     Cervical: No cervical adenopathy.     Right cervical: No superficial, deep or posterior cervical adenopathy.    Left cervical: No superficial, deep or posterior cervical adenopathy.     Upper Body:     Right upper body: No supraclavicular, axillary or epitrochlear adenopathy.     Left upper body: No supraclavicular, axillary or epitrochlear adenopathy.     Lower Body: No right inguinal adenopathy. No left inguinal adenopathy.  Skin:    General: Skin is warm and dry.     Findings: No rash.  Neurological:     Mental Status: Misty Robertson is alert and oriented to person, place, and time.     Assessment and Plan:  Misty Robertson is a 27 y.o. female presenting to the [facility] Department for STI screening  1. Screen for sexually transmitted diseases Pt self collected  - Chlamydia/Gonorrhea [facility] - WET PREP FOR TRICH, YEAST, CLUE  2. Screening examination for venereal disease    Patient accepted all screenings including vaginal CT/GC and bloodwork for HIV/RPR, and wet prep. Patient meets criteria for HepB screening? Yes. Ordered? no Patient meets criteria for HepC screening? Yes. Ordered? Pt declines bloodwork  Treat wet prep per standing order Discussed time line for [facility] results and that patient will be called with positive results and encouraged patient to call if Misty Robertson had not heard in 2 weeks.  Counseled to return or seek care for continued or worsening symptoms Recommended repeat testing in 3 months with positive results. Recommended condom use with all sex  Patient is currently using  nothing  to prevent pregnancy.    Return if symptoms worsen or fail to improve.  No future appointments.  Alberteen Spindle, CNM

## 2022-10-06 ENCOUNTER — Encounter: Payer: Self-pay | Admitting: Emergency Medicine

## 2022-10-06 ENCOUNTER — Other Ambulatory Visit: Payer: Self-pay

## 2022-10-06 DIAGNOSIS — R319 Hematuria, unspecified: Secondary | ICD-10-CM | POA: Diagnosis present

## 2022-10-06 DIAGNOSIS — N3090 Cystitis, unspecified without hematuria: Secondary | ICD-10-CM | POA: Insufficient documentation

## 2022-10-06 LAB — COMPREHENSIVE METABOLIC PANEL WITH GFR
ALT: 21 U/L (ref 0–44)
AST: 22 U/L (ref 15–41)
Albumin: 4.3 g/dL (ref 3.5–5.0)
Alkaline Phosphatase: 44 U/L (ref 38–126)
Anion gap: 11 (ref 5–15)
BUN: 12 mg/dL (ref 6–20)
CO2: 22 mmol/L (ref 22–32)
Calcium: 9.2 mg/dL (ref 8.9–10.3)
Chloride: 101 mmol/L (ref 98–111)
Creatinine, Ser: 0.93 mg/dL (ref 0.44–1.00)
GFR, Estimated: >60 mL/min
Glucose, Bld: 90 mg/dL (ref 70–99)
Potassium: 3.6 mmol/L (ref 3.5–5.1)
Sodium: 134 mmol/L — ABNORMAL LOW (ref 135–145)
Total Bilirubin: 0.5 mg/dL (ref 0.3–1.2)
Total Protein: 8.5 g/dL — ABNORMAL HIGH (ref 6.5–8.1)

## 2022-10-06 LAB — CBC WITH DIFFERENTIAL/PLATELET
Abs Immature Granulocytes: 0.01 K/uL (ref 0.00–0.07)
Basophils Absolute: 0 K/uL (ref 0.0–0.1)
Basophils Relative: 1 %
Eosinophils Absolute: 0 K/uL (ref 0.0–0.5)
Eosinophils Relative: 0 %
HCT: 37.6 % (ref 36.0–46.0)
Hemoglobin: 12.8 g/dL (ref 12.0–15.0)
Immature Granulocytes: 0 %
Lymphocytes Relative: 26 %
Lymphs Abs: 1.8 K/uL (ref 0.7–4.0)
MCH: 29 pg (ref 26.0–34.0)
MCHC: 34 g/dL (ref 30.0–36.0)
MCV: 85.1 fL (ref 80.0–100.0)
Monocytes Absolute: 0.6 K/uL (ref 0.1–1.0)
Monocytes Relative: 9 %
Neutro Abs: 4.5 K/uL (ref 1.7–7.7)
Neutrophils Relative %: 64 %
Platelets: 225 K/uL (ref 150–400)
RBC: 4.42 MIL/uL (ref 3.87–5.11)
RDW: 11.9 % (ref 11.5–15.5)
WBC: 7 K/uL (ref 4.0–10.5)
nRBC: 0 % (ref 0.0–0.2)

## 2022-10-06 LAB — URINALYSIS, ROUTINE W REFLEX MICROSCOPIC
Bilirubin Urine: NEGATIVE
Glucose, UA: NEGATIVE mg/dL
Ketones, ur: 5 mg/dL — AB
Nitrite: NEGATIVE
Protein, ur: 30 mg/dL — AB
RBC / HPF: >50 RBC/hpf (ref 0–5)
Specific Gravity, Urine: 1.024 (ref 1.005–1.030)
WBC, UA: >50 WBC/hpf (ref 0–5)
pH: 5 (ref 5.0–8.0)

## 2022-10-06 LAB — POC URINE PREG, ED: Preg Test, Ur: NEGATIVE

## 2022-10-06 MED ORDER — ACETAMINOPHEN 500 MG PO TABS
1000.0000 mg | ORAL_TABLET | Freq: Once | ORAL | Status: AC
  Administered 2022-10-06: 1000 mg via ORAL
  Filled 2022-10-06: qty 2

## 2022-10-06 NOTE — ED Triage Notes (Signed)
Pt presents ambulatory to triage via POV with complaints of hematuria and pelvic pain x 3 days. Endorses "red colored" urine and has some blood when wiping. Rates the discomfort 8/10. A&Ox4 at this time. Denies CP or SOB.

## 2022-10-07 ENCOUNTER — Emergency Department
Admission: EM | Admit: 2022-10-07 | Discharge: 2022-10-07 | Disposition: A | Discharge status: Home / Self Care | Payer: Medicaid Other | Attending: Emergency Medicine | Admitting: Emergency Medicine

## 2022-10-07 ENCOUNTER — Emergency Department: Payer: Medicaid Other

## 2022-10-07 DIAGNOSIS — N3090 Cystitis, unspecified without hematuria: Secondary | ICD-10-CM

## 2022-10-07 MED ORDER — CEPHALEXIN 500 MG PO CAPS: 500.0000 mg | ORAL_CAPSULE | Freq: Three times a day (TID) | ORAL | 0 refills | Status: AC

## 2022-10-07 MED ORDER — ONDANSETRON 4 MG PO TBDP: 4.0000 mg | ORAL_TABLET | Freq: Three times a day (TID) | ORAL | 0 refills | Status: AC | PRN

## 2022-10-07 MED ORDER — IBUPROFEN 600 MG PO TABS
600.0000 mg | ORAL_TABLET | Freq: Once | ORAL | Status: AC
  Administered 2022-10-07: 600 mg via ORAL
  Filled 2022-10-07: qty 1

## 2022-10-07 MED ORDER — CEPHALEXIN 500 MG PO CAPS
500.0000 mg | ORAL_CAPSULE | Freq: Once | ORAL | Status: AC
  Administered 2022-10-07: 500 mg via ORAL
  Filled 2022-10-07: qty 1

## 2022-10-07 NOTE — Discharge Instructions (Addendum)
Keflex antibiotic 3 times daily for the next week. Please finish all 21 pills, even if you're feeling better.   Zofran dissolving tablet as needed for any nausea or vomiting  Please take Tylenol and ibuprofen/Advil for your pain.  It is safe to take them together, or to alternate them every few hours.  Take up to 1000mg  of Tylenol at a time, up to 4 times per day.  Do not take more than 4000 mg of Tylenol in 24 hours.  For ibuprofen, take 400-600 mg, 3 - 4 times per day.

## 2022-10-07 NOTE — ED Provider Notes (Signed)
[facility] Provider Note    Event Date/Time   First MD Initiated Contact with Patient 10/07/22 0025     (approximate)   History   Hematuria   HPI  Misty Robertson is a 27 y.o. female who presents to the ED for evaluation of Hematuria   Patient presents alongside her friend for evaluation of hematuria, sensation of incomplete emptying and dysuria over the past few days.  Had a fever when she checked in but did not realize it at home.  No emesis, stool changes  Aside from her urinary changes she reports some right sided pelvic discomfort.  No history of kidney stones   Physical Exam   Triage Vital Signs: ED Triage Vitals  Encounter Vitals Group     BP 10/06/22 2139 118/76     Systolic BP Percentile --      Diastolic BP Percentile --      Pulse Rate 10/06/22 2139 93     Resp 10/06/22 2139 18     Temp 10/06/22 2139 (!) 101.7 F (38.7 C)     Temp Source 10/07/22 0112 Oral     SpO2 10/06/22 2139 100 %     Weight 10/06/22 2138 174 lb (78.9 kg)     Height 10/06/22 2138 5\' 4"  (1.626 m)     Head Circumference --      Peak Flow --      Pain Score 10/06/22 2138 8     Pain Loc --      Pain Education --      Exclude from Growth Chart --     Most recent vital signs: Vitals:   10/06/22 2139 10/07/22 0112  BP: 118/76 108/66  Pulse: 93 63  Resp: 18 14  Temp: (!) 101.7 F (38.7 C) (!) 97.5 F (36.4 C)  SpO2: 100% 99%    General: Awake, no distress.  CV:  Good peripheral perfusion.  Resp:  Normal effort.  Abd:  No distention.  Suprapubic tenderness without peritoneal features. MSK:  No deformity noted.  Neuro:  No focal deficits appreciated. Other:     ED Results / Procedures / Treatments   Labs (all labs ordered are listed, but only abnormal results are displayed) Labs Reviewed  COMPREHENSIVE METABOLIC PANEL - Abnormal; Notable for the following components:      Result Value   Sodium 134 (*)    Total Protein 8.5 (*)    All other  components within normal limits  URINALYSIS, ROUTINE W REFLEX MICROSCOPIC - Abnormal; Notable for the following components:   Color, Urine YELLOW (*)    APPearance CLOUDY (*)    Hgb urine dipstick MODERATE (*)    Ketones, ur 5 (*)    Protein, ur 30 (*)    Leukocytes,Ua LARGE (*)    Bacteria, UA RARE (*)    All other components within normal limits  URINE CULTURE  CBC WITH DIFFERENTIAL/PLATELET  POC URINE PREG, ED    EKG   RADIOLOGY CT renal study interpreted by me without evidence of acute pathology  Official radiology report(s): CT Renal Stone Study  Result Date: 10/07/2022 CLINICAL DATA:  Right-sided flank pain with hematuria, initial encounter EXAM: CT ABDOMEN AND PELVIS WITHOUT CONTRAST TECHNIQUE: Multidetector CT imaging of the abdomen and pelvis was performed following the standard protocol without IV contrast. RADIATION DOSE REDUCTION: This exam was performed according to the departmental dose-optimization program which includes automated exposure control, adjustment of the mA and/or kV according to patient  size and/or use of iterative reconstruction technique. COMPARISON:  06/01/2013 FINDINGS: Lower chest: No acute abnormality. Hepatobiliary: No focal liver abnormality is seen. No gallstones, gallbladder wall thickening, or biliary dilatation. Pancreas: Unremarkable. No pancreatic ductal dilatation or surrounding inflammatory changes. Spleen: Normal in size without focal abnormality. Adrenals/Urinary Tract: Adrenal glands are within normal limits. Kidneys are well visualized bilaterally. No renal calculi or obstructive changes are seen. Bladder is within normal limits. Stomach/Bowel: No obstructive or inflammatory changes of the colon are noted. Appendix is within normal limits. Small bowel and stomach are within normal limits. Vascular/Lymphatic: No significant vascular findings are present. No enlarged abdominal or pelvic lymph nodes. Reproductive: Uterus and bilateral adnexa are  unremarkable. Other: No abdominal wall hernia or abnormality. No abdominopelvic ascites. Musculoskeletal: No acute or significant osseous findings. IMPRESSION: No obstructive changes are noted.  No acute abnormality noted. Electronically Signed   By: Alcide Clever M.D.   On: 10/07/2022 02:18    PROCEDURES and INTERVENTIONS:  .Critical Care  Performed by: Delton Prairie, MD Authorized by: Delton Prairie, MD   Critical care provider statement:    Critical care time (minutes):  30   Critical care time was exclusive of:  Separately billable procedures and treating other patients   Critical care was necessary to treat or prevent imminent or life-threatening deterioration of the following conditions:  Sepsis   Critical care was time spent personally by me on the following activities:  Development of treatment plan with patient or surrogate, discussions with consultants, evaluation of patient's response to treatment, examination of patient, ordering and review of laboratory studies, ordering and review of radiographic studies, ordering and performing treatments and interventions, pulse oximetry, re-evaluation of patient's condition and review of old charts   Medications  acetaminophen (TYLENOL) tablet 1,000 mg (1,000 mg Oral Given 10/06/22 2143)  cephALEXin (KEFLEX) capsule 500 mg (500 mg Oral Given 10/07/22 0059)  ibuprofen (ADVIL) tablet 600 mg (600 mg Oral Given 10/07/22 0059)     IMPRESSION / MDM / ASSESSMENT AND PLAN / ED COURSE  I reviewed the triage vital signs and the nursing notes.  Differential diagnosis includes, but is not limited to, UTI, sepsis, pyelonephritis, obstructive uropathy, appendicitis  {Patient presents with symptoms of an acute illness or injury that is potentially life-threatening.  Well-appearing 27 year old presents with signs of sepsis from acute cystitis and possibly in a send component considering her right sided pelvic discomfort.  Febrile in triage with borderline  heart rate meet SIRS criteria.  Her serum workup is reassuring with a normal metabolic panel and CBC.  Urine is sent for culture and she is started on antibiotics.  CT obtained due to her family history of ureteral stones and right sided discomfort to ensure no signs of appendicitis, obstructive uropathy and to assess for signs of pyelonephritis that may be present.  We discussed the possibility of medical admission, but she looks well and hesitant to stay, which think is reasonable.  We will obtain a CT renal study and reassess.  While I considered observation admission for this patient as she is meeting sepsis criteria, she looks quite well and suitable for trial of outpatient management.  Clinical Course as of 10/07/22 0656  Fri Oct 07, 2022  0340 Reassessed, feeling better.  Discussed reassuring CT.  Shared decision making we will plan to pursue outpatient management.  Discussed antibiotics [DS]    Clinical Course User Index [DS] Delton Prairie, MD     FINAL CLINICAL IMPRESSION(S) / ED DIAGNOSES  Final diagnoses:  Cystitis     Rx / DC Orders   ED Discharge Orders          Ordered    cephALEXin (KEFLEX) 500 MG capsule  3 times daily        10/07/22 0341    ondansetron (ZOFRAN-ODT) 4 MG disintegrating tablet  Every 8 hours PRN        10/07/22 0341             Note:  This document was prepared using Dragon voice recognition software and may include unintentional dictation errors.   Delton Prairie, MD 10/07/22 [phone]

## 2022-10-09 LAB — URINE CULTURE: Culture: >=100000 — AB

## 2022-10-20 ENCOUNTER — Ambulatory Visit: Payer: Medicaid Other
